# Patient Record
Sex: Female | Born: 1935 | Race: White | Hispanic: No | Smoking: Former smoker
Health system: Southern US, Community
[De-identification: ages and names within clinical notes are randomized; demographics above are authoritative.]

## PROBLEM LIST (undated history)

## (undated) DIAGNOSIS — F32A Depression, unspecified: Secondary | ICD-10-CM

## (undated) DIAGNOSIS — K259 Gastric ulcer, unspecified as acute or chronic, without hemorrhage or perforation: Secondary | ICD-10-CM

## (undated) DIAGNOSIS — H3530 Unspecified macular degeneration: Secondary | ICD-10-CM

## (undated) DIAGNOSIS — K602 Anal fissure, unspecified: Secondary | ICD-10-CM

## (undated) DIAGNOSIS — F419 Anxiety disorder, unspecified: Secondary | ICD-10-CM

## (undated) DIAGNOSIS — F329 Major depressive disorder, single episode, unspecified: Secondary | ICD-10-CM

## (undated) DIAGNOSIS — IMO0001 Reserved for inherently not codable concepts without codable children: Secondary | ICD-10-CM

## (undated) DIAGNOSIS — E785 Hyperlipidemia, unspecified: Secondary | ICD-10-CM

## (undated) DIAGNOSIS — M1990 Unspecified osteoarthritis, unspecified site: Secondary | ICD-10-CM

## (undated) DIAGNOSIS — H548 Legal blindness, as defined in USA: Secondary | ICD-10-CM

## (undated) DIAGNOSIS — E079 Disorder of thyroid, unspecified: Secondary | ICD-10-CM

## (undated) DIAGNOSIS — H9190 Unspecified hearing loss, unspecified ear: Secondary | ICD-10-CM

## (undated) DIAGNOSIS — I10 Essential (primary) hypertension: Secondary | ICD-10-CM

## (undated) DIAGNOSIS — D649 Anemia, unspecified: Secondary | ICD-10-CM

## (undated) DIAGNOSIS — N390 Urinary tract infection, site not specified: Secondary | ICD-10-CM

## (undated) DIAGNOSIS — H409 Unspecified glaucoma: Secondary | ICD-10-CM

## (undated) DIAGNOSIS — Z8711 Personal history of peptic ulcer disease: Secondary | ICD-10-CM

## (undated) HISTORY — DX: Disorder of thyroid, unspecified: E07.9

## (undated) HISTORY — DX: Unspecified glaucoma: H40.9

## (undated) HISTORY — DX: Unspecified hearing loss, unspecified ear: H91.90

## (undated) HISTORY — DX: Anemia, unspecified: D64.9

## (undated) HISTORY — DX: Legal blindness, as defined in USA: H54.8

## (undated) HISTORY — DX: Reserved for inherently not codable concepts without codable children: IMO0001

## (undated) HISTORY — DX: Unspecified osteoarthritis, unspecified site: M19.90

## (undated) HISTORY — PX: FINGER SURGERY: SHX640

## (undated) HISTORY — DX: Gastric ulcer, unspecified as acute or chronic, without hemorrhage or perforation: K25.9

## (undated) HISTORY — DX: Anal fissure, unspecified: K60.2

## (undated) HISTORY — DX: Depression, unspecified: F32.A

## (undated) HISTORY — DX: Anxiety disorder, unspecified: F41.9

## (undated) HISTORY — PX: BACK SURGERY: SHX140

## (undated) HISTORY — DX: Unspecified macular degeneration: H35.30

## (undated) HISTORY — DX: Hyperlipidemia, unspecified: E78.5

## (undated) HISTORY — DX: Urinary tract infection, site not specified: N39.0

## (undated) HISTORY — PX: APPENDECTOMY: SHX54

---
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
## 1999-03-19 ENCOUNTER — Other Ambulatory Visit: Admission: RE | Admit: 1999-03-19 | Discharge: 1999-03-19 | Payer: Self-pay | Admitting: Obstetrics and Gynecology

## 2000-04-24 ENCOUNTER — Other Ambulatory Visit: Admission: RE | Admit: 2000-04-24 | Discharge: 2000-04-24 | Payer: Self-pay | Admitting: Obstetrics and Gynecology

## 2000-05-25 ENCOUNTER — Encounter: Payer: Self-pay | Admitting: Obstetrics and Gynecology

## 2000-05-25 ENCOUNTER — Encounter: Admission: RE | Admit: 2000-05-25 | Discharge: 2000-05-25 | Payer: Self-pay | Admitting: Obstetrics and Gynecology

## 2000-12-27 ENCOUNTER — Emergency Department (HOSPITAL_COMMUNITY): Admission: EM | Admit: 2000-12-27 | Discharge: 2000-12-27 | Payer: Self-pay | Admitting: Internal Medicine

## 2001-06-02 ENCOUNTER — Encounter: Admission: RE | Admit: 2001-06-02 | Discharge: 2001-06-02 | Payer: Self-pay | Admitting: Obstetrics and Gynecology

## 2001-06-02 ENCOUNTER — Encounter: Payer: Self-pay | Admitting: Obstetrics and Gynecology

## 2002-04-03 ENCOUNTER — Emergency Department (HOSPITAL_COMMUNITY): Admission: EM | Admit: 2002-04-03 | Discharge: 2002-04-03 | Payer: Self-pay | Admitting: Emergency Medicine

## 2002-05-18 ENCOUNTER — Encounter: Payer: Self-pay | Admitting: Family Medicine

## 2002-05-18 ENCOUNTER — Encounter: Admission: RE | Admit: 2002-05-18 | Discharge: 2002-05-18 | Payer: Self-pay | Admitting: Family Medicine

## 2003-11-09 ENCOUNTER — Encounter: Admission: RE | Admit: 2003-11-09 | Discharge: 2003-11-09 | Payer: Self-pay | Admitting: Family Medicine

## 2005-01-27 ENCOUNTER — Encounter: Admission: RE | Admit: 2005-01-27 | Discharge: 2005-01-27 | Payer: Self-pay | Admitting: Family Medicine

## 2005-02-18 ENCOUNTER — Other Ambulatory Visit: Admission: RE | Admit: 2005-02-18 | Discharge: 2005-02-18 | Payer: Self-pay | Admitting: Gynecology

## 2005-05-05 ENCOUNTER — Ambulatory Visit: Payer: Self-pay | Admitting: Internal Medicine

## 2006-04-10 ENCOUNTER — Encounter: Admission: RE | Admit: 2006-04-10 | Discharge: 2006-04-10 | Payer: Self-pay | Admitting: Family Medicine

## 2006-09-10 ENCOUNTER — Ambulatory Visit: Payer: Self-pay | Admitting: Internal Medicine

## 2007-06-24 ENCOUNTER — Encounter: Admission: RE | Admit: 2007-06-24 | Discharge: 2007-06-24 | Payer: Self-pay | Admitting: Gynecology

## 2007-06-30 ENCOUNTER — Encounter: Admission: RE | Admit: 2007-06-30 | Discharge: 2007-06-30 | Payer: Self-pay | Admitting: Gynecology

## 2007-09-08 DIAGNOSIS — I10 Essential (primary) hypertension: Secondary | ICD-10-CM | POA: Insufficient documentation

## 2007-09-08 DIAGNOSIS — K279 Peptic ulcer, site unspecified, unspecified as acute or chronic, without hemorrhage or perforation: Secondary | ICD-10-CM | POA: Insufficient documentation

## 2007-09-08 DIAGNOSIS — IMO0002 Reserved for concepts with insufficient information to code with codable children: Secondary | ICD-10-CM

## 2007-09-10 ENCOUNTER — Encounter: Payer: Self-pay | Admitting: Internal Medicine

## 2008-06-23 ENCOUNTER — Encounter: Admission: RE | Admit: 2008-06-23 | Discharge: 2008-06-23 | Payer: Self-pay | Admitting: Family Medicine

## 2009-03-31 ENCOUNTER — Emergency Department (HOSPITAL_COMMUNITY): Admission: EM | Admit: 2009-03-31 | Discharge: 2009-03-31 | Payer: Self-pay | Admitting: Emergency Medicine

## 2009-07-11 ENCOUNTER — Encounter: Admission: RE | Admit: 2009-07-11 | Discharge: 2009-07-11 | Payer: Self-pay | Admitting: Family Medicine

## 2010-08-02 ENCOUNTER — Emergency Department (HOSPITAL_COMMUNITY): Admission: EM | Admit: 2010-08-02 | Discharge: 2010-08-02 | Payer: Self-pay | Admitting: Emergency Medicine

## 2010-09-11 ENCOUNTER — Encounter
Admission: RE | Admit: 2010-09-11 | Discharge: 2010-09-11 | Payer: Self-pay | Source: Home / Self Care | Attending: Family Medicine | Admitting: Family Medicine

## 2010-09-29 ENCOUNTER — Encounter: Payer: Self-pay | Admitting: Gynecology

## 2011-01-24 NOTE — Assessment & Plan Note (Signed)
[facility]                             PULMONARY OFFICE NOTE   NAME:JOHNSONEldred, Lievanos                      MRN:          [id number]  DATE:09/10/2006                            DOB:          16-May-1927    PROBLEM:  Allergic rhinitis.   HISTORY:  One-year followup.  She says she is doing quite well.  Last  weekend she had a viral illness with progressive hoarseness and coughing  with clear sputum, but she is now getting better.  No high fever or  chills.  She asks to keep available standby prescription for chewable  amoxicillin 500 mg t.i.d. x7 days.  We discussed this.  She has not had  cough or urticaria that could be attributed to her Prinivil.   MEDICATIONS:  Limited to lisinopril.  She uses a p.r.n. over-the-counter  antihistamine.   Drug intolerance to PREDNISONE said to make her nervous and NEO-  SYNEPHRINE said to cause tachycardia.   PAST MEDICAL HISTORY:  1. Peptic ulcer disease (Dr. Lina Sar.)  2. Smoked 1 pack per day until she quit in 1983.  3. Left lower lobe 1990.  4. Hypertension, treated with Prinivil.   OBJECTIVE:  Weight 160 pounds.  BP 122/78.  Pulse regular 79.  Room air  saturation 99%.  Nasal airway is clear.  Minimally atrophic with no postnasal drainage  seen.  Conjunctivae are clear.  Voice quality is normal.  LUNGS:  Clear to percussion and auscultation.  HEART:  Sounds are regular without murmur or gallop.   IMPRESSION:  She has had a recent cold, but his getting over it.  Allergic rhinitis has been adequately controlled with over-the-counter  therapies, and this is off season.   PLAN:  1. Chest x-ray because of her history of prolonged smoking, remote.  2. Refillable chewable amoxicillin 500 mg t.i.d. x7 days to use in      case of purulent sputum as discussed.  3. Routine supportive care for her current cold.  4. Schedule return in 12 months, but earlier p.r.n.    Clinton D. Maple Hudson, MD, Tonny Bollman, FACP  Electronically Signed   CDY/MedQ  DD: 09/12/2006  DT: 09/12/2006  Job #: [id number]   cc:   Pam Drown, M.D.

## 2011-04-29 ENCOUNTER — Encounter (INDEPENDENT_AMBULATORY_CARE_PROVIDER_SITE_OTHER): Payer: Self-pay | Admitting: Ophthalmology

## 2011-12-10 ENCOUNTER — Other Ambulatory Visit: Payer: Self-pay | Admitting: Family Medicine

## 2011-12-10 DIAGNOSIS — Z1231 Encounter for screening mammogram for malignant neoplasm of breast: Secondary | ICD-10-CM

## 2011-12-23 ENCOUNTER — Ambulatory Visit
Admission: RE | Admit: 2011-12-23 | Discharge: 2011-12-23 | Disposition: A | Discharge status: Home / Self Care | Payer: Medicare Other | Source: Ambulatory Visit | Attending: Family Medicine | Admitting: Family Medicine

## 2011-12-23 DIAGNOSIS — Z1231 Encounter for screening mammogram for malignant neoplasm of breast: Secondary | ICD-10-CM

## 2012-10-07 ENCOUNTER — Ambulatory Visit (INDEPENDENT_AMBULATORY_CARE_PROVIDER_SITE_OTHER): Payer: Medicare Other | Admitting: Ophthalmology

## 2012-10-18 ENCOUNTER — Ambulatory Visit (INDEPENDENT_AMBULATORY_CARE_PROVIDER_SITE_OTHER): Payer: Medicare Other | Admitting: Ophthalmology

## 2012-10-18 DIAGNOSIS — H3530 Unspecified macular degeneration: Secondary | ICD-10-CM

## 2012-10-18 DIAGNOSIS — H43819 Vitreous degeneration, unspecified eye: Secondary | ICD-10-CM

## 2012-10-18 DIAGNOSIS — I10 Essential (primary) hypertension: Secondary | ICD-10-CM

## 2012-10-18 DIAGNOSIS — H35039 Hypertensive retinopathy, unspecified eye: Secondary | ICD-10-CM

## 2013-02-21 ENCOUNTER — Encounter (HOSPITAL_COMMUNITY): Payer: Self-pay | Admitting: Emergency Medicine

## 2013-02-21 ENCOUNTER — Emergency Department (HOSPITAL_COMMUNITY)
Admission: EM | Admit: 2013-02-21 | Discharge: 2013-02-21 | Disposition: A | Discharge status: Home / Self Care | Payer: Medicare Other | Attending: Emergency Medicine | Admitting: Emergency Medicine

## 2013-02-21 ENCOUNTER — Emergency Department (HOSPITAL_COMMUNITY): Payer: Medicare Other

## 2013-02-21 DIAGNOSIS — Z79899 Other long term (current) drug therapy: Secondary | ICD-10-CM | POA: Insufficient documentation

## 2013-02-21 DIAGNOSIS — Z8711 Personal history of peptic ulcer disease: Secondary | ICD-10-CM | POA: Insufficient documentation

## 2013-02-21 DIAGNOSIS — R002 Palpitations: Secondary | ICD-10-CM

## 2013-02-21 LAB — CBC
HCT: 40.1 % (ref 36.0–46.0)
Hemoglobin: 13.5 g/dL (ref 12.0–15.0)
MCH: 28.6 pg (ref 26.0–34.0)
MCHC: 33.7 g/dL (ref 30.0–36.0)
MCV: 85 fL (ref 78.0–100.0)
Platelets: 333 10*3/uL (ref 150–400)
RDW: 12.9 % (ref 11.5–15.5)
WBC: 6.9 10*3/uL (ref 4.0–10.5)

## 2013-02-21 LAB — POCT I-STAT TROPONIN I: Troponin i, poc: 0.01 ng/mL (ref 0.00–0.08)

## 2013-02-21 LAB — COMPREHENSIVE METABOLIC PANEL
AST: 17 U/L (ref 0–37)
Calcium: 9.8 mg/dL (ref 8.4–10.5)
GFR calc non Af Amer: 77 mL/min — ABNORMAL LOW (ref >90)
Total Protein: 7.8 g/dL (ref 6.0–8.3)

## 2013-02-21 NOTE — ED Provider Notes (Signed)
History   CSN: [id number] Arrival date & time 02/21/13  4098 First MD Initiated Contact with Patient 02/21/13 [phone]     Chief Complaint  Patient presents with  . Irregular Heart Beat   HPI Patient presents to the emergency room with complaints of palpitations that started about 3 days ago. Patient denies any issues with dizziness, nausea vomiting or chest pain. Palpitations are sporadic she does not feel that her heart is going particularly fast. Right now she feels fine and does not have any complaints. The patient was concerned though because approximately 18 years ago she had a similar episode. She was evaluated by cardiology a couple of times. Eventually the patient ended up becoming anemic and he determined that she had a bleeding gastric ulcer. She required blood transfusions at that time. Patient denies any abdominal pain. She has not noticed any blood in her stool but her vision is poor.  History reviewed. No pertinent past medical history.  History reviewed. No pertinent past surgical history.  No family history on file.  History  Substance Use Topics  . Smoking status: Not on file  . Smokeless tobacco: Not on file  . Alcohol Use: Not on file    OB History   Grav Para Term Preterm Abortions TAB SAB Ect Mult Living                  Review of Systems  All other systems reviewed and are negative.    Allergies  Prednisone  Home Medications   Current Outpatient Rx  Name  Route  Sig  Dispense  Refill  . beta carotene w/minerals (OCUVITE) tablet   Oral   Take 1-2 tablets by mouth daily.         Marland Kitchen latanoprost (XALATAN) 0.005 % ophthalmic solution   Both Eyes   Place 1 drop into both eyes every morning.         Marland Kitchen lisinopril-hydrochlorothiazide (PRINZIDE,ZESTORETIC) 20-12.5 MG per tablet   Oral   Take 1 tablet by mouth daily.         . sodium chloride (OCEAN) 0.65 % nasal spray   Nasal   Place 1 spray into the nose daily as needed for congestion.            BP 127/64  Pulse 66  Temp(Src) 97.4 F (36.3 C) (Oral)  Resp 18  Ht 5' 4.5" (1.638 m)  Wt 145 lb (65.772 kg)  BMI 24.51 kg/m2  SpO2 98%  Physical Exam  Nursing note and vitals reviewed. Constitutional: She appears well-developed and well-nourished. No distress.  HENT:  Head: Normocephalic and atraumatic.  Right Ear: External ear normal.  Left Ear: External ear normal.  Eyes: Conjunctivae are normal. Right eye exhibits no discharge. Left eye exhibits no discharge. No scleral icterus.  Neck: Neck supple. No tracheal deviation present.  Cardiovascular: Normal rate, regular rhythm and intact distal pulses.   Pulmonary/Chest: Effort normal and breath sounds normal. No stridor. No respiratory distress. She has no wheezes. She has no rales.  Abdominal: Soft. Bowel sounds are normal. She exhibits no distension. There is no tenderness. There is no rebound and no guarding.  Musculoskeletal: She exhibits no edema and no tenderness.  Neurological: She is alert. She has normal strength. No sensory deficit. Cranial nerve deficit:  no gross defecits noted. She exhibits normal muscle tone. She displays no seizure activity. Coordination normal.  Skin: Skin is warm and dry. No rash noted.  Psychiatric: She has a normal mood and  affect.    ED Course  Procedures (including critical care time)  Rate: 71  Rhythm: normal sinus rhythm  QRS Axis: normal  Intervals: normal  ST/T Wave abnormalities: normal  Conduction Disutrbances:none  Narrative Interpretation: nl except poor r wave progression anterior leads  Old EKG Reviewed: none available  Labs Reviewed  COMPREHENSIVE METABOLIC PANEL - Abnormal; Notable for the following:    Glucose, Bld 101 (*)    GFR calc non Af Amer 77 (*)    GFR calc Af Amer 89 (*)    All other components within normal limits  CBC  POCT I-STAT TROPONIN I   Dg Chest 2 View  02/21/2013   *RADIOLOGY REPORT*  Clinical Data: Shortness of breath with cough for 3 days.  Tachycardia.  History of hypertension.  CHEST - 2 VIEW  Comparison: 03/31/2009.  Findings: The heart size and mediastinal contours are stable.  The lungs are clear.  There is no pleural effusion or pneumothorax. The osseous structures appear unchanged.  Telemetry leads overlie the chest.  IMPRESSION: Stable chest.  No acute cardiopulmonary process.   Original Report Authenticated By: Carey Bullocks, M.D.     1. Palpitations       MDM  Patient's evaluation was reassuring in emergency department. She has no cardiac dysrhythmia noted on her EKG. Patient is not anemic.  Her vital signs have remained stable.  Monitor did indicate that she was having occasional PVCs although I did not observe any loss of the bedside. This possible this could be the cause of her symptoms I recommend that she follow up with her primary Dr. Consider Holter monitoring testing.        Celene Kras, MD 02/21/13 [phone]

## 2013-02-21 NOTE — ED Notes (Signed)
Pt c/o heart palpitations that started 3 days ago. Pt states that this is a new onset, not associated with any dizziness, nausea/vomiting, chest pain. NAD at this time.

## 2013-04-13 ENCOUNTER — Other Ambulatory Visit: Payer: Self-pay

## 2013-05-10 ENCOUNTER — Other Ambulatory Visit: Payer: Self-pay

## 2013-05-10 DIAGNOSIS — Z1231 Encounter for screening mammogram for malignant neoplasm of breast: Secondary | ICD-10-CM

## 2013-06-01 ENCOUNTER — Ambulatory Visit: Payer: Medicare Other

## 2013-06-07 ENCOUNTER — Ambulatory Visit
Admission: RE | Admit: 2013-06-07 | Discharge: 2013-06-07 | Disposition: A | Discharge status: Home / Self Care | Payer: Medicare Other | Source: Ambulatory Visit

## 2013-06-07 DIAGNOSIS — Z1231 Encounter for screening mammogram for malignant neoplasm of breast: Secondary | ICD-10-CM

## 2013-06-30 ENCOUNTER — Telehealth: Payer: Self-pay | Admitting: Pulmonary Disease

## 2013-06-30 ENCOUNTER — Emergency Department (HOSPITAL_COMMUNITY)
Admission: EM | Admit: 2013-06-30 | Discharge: 2013-06-30 | Disposition: A | Discharge status: Home / Self Care | Payer: Medicare Other | Attending: Emergency Medicine | Admitting: Emergency Medicine

## 2013-06-30 ENCOUNTER — Emergency Department (HOSPITAL_COMMUNITY): Payer: Medicare Other

## 2013-06-30 ENCOUNTER — Encounter (HOSPITAL_COMMUNITY): Payer: Self-pay | Admitting: Emergency Medicine

## 2013-06-30 DIAGNOSIS — R059 Cough, unspecified: Secondary | ICD-10-CM | POA: Insufficient documentation

## 2013-06-30 DIAGNOSIS — J984 Other disorders of lung: Secondary | ICD-10-CM | POA: Insufficient documentation

## 2013-06-30 DIAGNOSIS — R05 Cough: Secondary | ICD-10-CM | POA: Insufficient documentation

## 2013-06-30 DIAGNOSIS — I10 Essential (primary) hypertension: Secondary | ICD-10-CM | POA: Insufficient documentation

## 2013-06-30 DIAGNOSIS — Z79899 Other long term (current) drug therapy: Secondary | ICD-10-CM | POA: Insufficient documentation

## 2013-06-30 DIAGNOSIS — R911 Solitary pulmonary nodule: Secondary | ICD-10-CM

## 2013-06-30 DIAGNOSIS — J189 Pneumonia, unspecified organism: Secondary | ICD-10-CM

## 2013-06-30 HISTORY — DX: Essential (primary) hypertension: I10

## 2013-06-30 LAB — BASIC METABOLIC PANEL
BUN: 9 mg/dL (ref 6–23)
CO2: 26 mEq/L (ref 19–32)
Calcium: 9.6 mg/dL (ref 8.4–10.5)
Chloride: 98 mEq/L (ref 96–112)
GFR calc non Af Amer: 79 mL/min — ABNORMAL LOW (ref >90)
Glucose, Bld: 93 mg/dL (ref 70–99)
Potassium: 3.6 mEq/L (ref 3.5–5.1)
Sodium: 133 mEq/L — ABNORMAL LOW (ref 135–145)

## 2013-06-30 LAB — CBC WITH DIFFERENTIAL/PLATELET
Basophils Relative: 0 % (ref 0–1)
Eosinophils Absolute: 0.1 10*3/uL (ref 0.0–0.7)
HCT: 39.4 % (ref 36.0–46.0)
Hemoglobin: 12.9 g/dL (ref 12.0–15.0)
Lymphs Abs: 1.5 10*3/uL (ref 0.7–4.0)
MCH: 28 pg (ref 26.0–34.0)
MCHC: 32.7 g/dL (ref 30.0–36.0)
Monocytes Absolute: 1.1 10*3/uL — ABNORMAL HIGH (ref 0.1–1.0)
Monocytes Relative: 10 % (ref 3–12)
Neutro Abs: 8.2 10*3/uL — ABNORMAL HIGH (ref 1.7–7.7)
RBC: 4.6 MIL/uL (ref 3.87–5.11)
RDW: 13.3 % (ref 11.5–15.5)

## 2013-06-30 MED ORDER — IOHEXOL 300 MG/ML  SOLN
80.0000 mL | Freq: Once | INTRAMUSCULAR | Status: AC | PRN
  Administered 2013-06-30: 80 mL via INTRAVENOUS

## 2013-06-30 MED ORDER — ALBUTEROL SULFATE (5 MG/ML) 0.5% IN NEBU
5.0000 mg | INHALATION_SOLUTION | Freq: Once | RESPIRATORY_TRACT | Status: AC
Start: 2013-06-30 — End: 2013-06-30
  Administered 2013-06-30: 5 mg via RESPIRATORY_TRACT
  Filled 2013-06-30: qty 1

## 2013-06-30 MED ORDER — DOXYCYCLINE HYCLATE 100 MG PO CAPS: 100.0000 mg | ORAL_CAPSULE | Freq: Two times a day (BID) | ORAL | Status: DC

## 2013-06-30 MED ORDER — SODIUM CHLORIDE 0.9 % IN NEBU
INHALATION_SOLUTION | RESPIRATORY_TRACT | Status: AC
  Filled 2013-06-30: qty 3

## 2013-06-30 MED ORDER — ALBUTEROL SULFATE HFA 108 (90 BASE) MCG/ACT IN AERS: 1.0000 | INHALATION_SPRAY | Freq: Four times a day (QID) | RESPIRATORY_TRACT | Status: DC | PRN

## 2013-06-30 NOTE — ED Notes (Signed)
Pt reports that she has been ShOB since Saturday and was hoarse with a cough on Sunday, pt was seen at [facility] on Sunday and was told to drink fluids and take a decongestant. Pt reports she feels like her congestion is not improving.

## 2013-06-30 NOTE — ED Provider Notes (Signed)
CSN: [id number]     Arrival date & time 06/30/13  6578 History   First MD Initiated Contact with Patient 06/30/13 [phone]     Chief Complaint  Patient presents with  . Shortness of Breath  . Cough   (Consider location/radiation/quality/duration/timing/severity/associated sxs/prior Treatment) HPI Comments: Patient is an 79 year old female with a past medical history of hypertension who presents with a 5 day history of SOB with associated cough. Symptoms started gradually and progressively worsened since the onset. Patient saw a doctor at [facility] a few days ago and was instructed to take decongestants and drink plenty of fluids. Patient abided by these instructions and did not feel any improvement. Patient reports associated chest congestion. No aggravating/alleviating factors. No other associated symptoms.   Patient is a 79 y.o. female presenting with shortness of breath and cough.  Shortness of Breath Associated symptoms: cough   Cough Associated symptoms: shortness of breath     Past Medical History  Diagnosis Date  . Hypertension    History reviewed. No pertinent past surgical history. History reviewed. No pertinent family history. History  Substance Use Topics  . Smoking status: Unknown If Ever Smoked  . Smokeless tobacco: Never Used  . Alcohol Use: No   OB History   Grav Para Term Preterm Abortions TAB SAB Ect Mult Living                 Review of Systems  Respiratory: Positive for cough and shortness of breath.   All other systems reviewed and are negative.    Allergies  Prednisone  Home Medications   Current Outpatient Rx  Name  Route  Sig  Dispense  Refill  . beta carotene w/minerals (OCUVITE) tablet   Oral   Take 1-2 tablets by mouth daily.         Marland Kitchen latanoprost (XALATAN) 0.005 % ophthalmic solution   Both Eyes   Place 1 drop into both eyes every morning.         Marland Kitchen lisinopril-hydrochlorothiazide (PRINZIDE,ZESTORETIC) 20-12.5 MG per tablet    Oral   Take 1 tablet by mouth daily.         . sodium chloride (OCEAN) 0.65 % nasal spray   Nasal   Place 1 spray into the nose daily as needed for congestion.          BP 173/92  Pulse 80  Temp(Src) 97.8 F (36.6 C) (Oral)  Resp 20  SpO2 100% Physical Exam  Nursing note and vitals reviewed. Constitutional: She is oriented to person, place, and time. She appears well-developed and well-nourished. No distress.  HENT:  Head: Normocephalic and atraumatic.  Eyes: Conjunctivae and EOM are normal.  Neck: Normal range of motion.  Cardiovascular: Normal rate and regular rhythm.  Exam reveals no gallop and no friction rub.   No murmur heard. Pulmonary/Chest: Effort normal and breath sounds normal. She has no wheezes. She has no rales. She exhibits no tenderness.  Abdominal: Soft. She exhibits no distension. There is no tenderness. There is no rebound and no guarding.  Musculoskeletal: Normal range of motion.  Neurological: She is alert and oriented to person, place, and time. Coordination normal.  Speech is goal-oriented. Moves limbs without ataxia.   Skin: Skin is warm and dry.  Psychiatric: She has a normal mood and affect. Her behavior is normal.    ED Course  Procedures (including critical care time) Labs Review Labs Reviewed  CBC WITH DIFFERENTIAL - Abnormal; Notable for the following:  WBC 11.0 (*)    Neutro Abs 8.2 (*)    Monocytes Absolute 1.1 (*)    All other components within normal limits  BASIC METABOLIC PANEL - Abnormal; Notable for the following:    Sodium 133 (*)    GFR calc non Af Amer 79 (*)    All other components within normal limits   Imaging Review Dg Chest 2 View  06/30/2013   CLINICAL DATA:  Cough, shortness of breath, congestion, chest tightness. Previous smoker.  EXAM: CHEST  2 VIEW  COMPARISON:  02/21/2013.  FINDINGS: Emphysematous changes in the lungs. There are 2 nodular opacities demonstrated, an 8 mm opacity in the right mid lung and a 7 mm  opacity in the left mid lung. These are new since the previous study. CT is recommended for further evaluation. Given the short interval appearance of these nodules, inflammatory process may be present. However, developing neoplastic nodules are not excluded. No airspace disease in the lungs. No blunting of costophrenic angles. Normal heart size and pulmonary vascularity. Calcified and tortuous aorta. Degenerative changes in the spine.  IMPRESSION: New appearance of indeterminate nodules in both lungs. CT recommended for further evaluation. Diffuse emphysematous changes. No focal consolidation.   Electronically Signed   By: Burman Nieves M.D.   On: 06/30/2013 06:39   Ct Chest W Contrast  06/30/2013   CLINICAL DATA:  Evaluate lung nodules  EXAM: CT CHEST WITH CONTRAST  TECHNIQUE: Multidetector CT imaging of the chest was performed during intravenous contrast administration.  CONTRAST:  80mL OMNIPAQUE IOHEXOL 300 MG/ML  SOLN  COMPARISON:  06/30/2013  FINDINGS: There is no pleural effusion identified. No significant airspace consolidation identified.  There is mild bronchiectasis within the right middle lobe and right upper lobe. Peripheral tree-in-bud nodules are identified involving the upper lobes and right middle lobe. Rounded nodule in the right upper lobe does not have the typical tree-in-bud configuration measuring 7 mm.  The trachea appears patent and is midline. The heart size appears normal. No pericardial effusion. No mediastinal or hilar adenopathy identified. There is no enlarged axillary or supraclavicular lymph nodes.  Incidental imaging through the upper abdomen shows a low attenuation structure within the posterior right hepatic lobe measuring 7 mm, image 52/ series 2. More centrally located is a 5 mm hypodensity. No acute findings identified within the upper abdomen.  Review of the visualized bony structures is significant for mild thoracic spondylosis. No aggressive lytic or sclerotic bone  abnormalities identified.  IMPRESSION: 1. The spectrum of findings within the lungs are consistent with a chronic atypical indolent infection. 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 Rounded nodule in the right upper lobe does not have the typical tree in bud configuration of bronchiolitis. If the patient is at high risk for bronchogenic carcinoma, follow-up chest CT at 3-59months is recommended. If the patient is at low risk for bronchogenic carcinoma, follow-up chest CT at 6-12 months is recommended. This recommendation follows the consensus statement: Guidelines for Management of Small Pulmonary Nodules Detected on CT Scans: A Statement from the Fleischner Society as published in Radiology 2005; 237:395-400.   Electronically Signed   By: Signa Kell M.D.   On: 06/30/2013 08:33    EKG Interpretation   None       MDM   1. Lung nodule   2. Lung infection     6:34 AM Chest xray pending. Vitals stable and patient afebrile.   9:33 AM Chest xray recommended follow up CT,  which was ordered here. CT shows chronic atypical infection and also a 7mm nodule that should be monitored. I spoke with Dr. Craige Cotta who recommend Doxycycline for now and follow up with Pulmonologist in office. Patient will have albuterol inhaler and be discharged home.    Emilia Beck, [place] 06/30/13 [phone]

## 2013-06-30 NOTE — Telephone Encounter (Signed)
Ct Chest W Contrast 06/30/2013   CLINICAL DATA:  Evaluate lung nodules  EXAM: CT CHEST WITH CONTRAST  TECHNIQUE: Multidetector CT imaging of the chest was performed during intravenous contrast administration.  CONTRAST:  80mL OMNIPAQUE IOHEXOL 300 MG/ML  SOLN  COMPARISON:  06/30/2013   FINDINGS:  There is no pleural effusion identified. No significant airspace consolidation identified.  There is mild bronchiectasis within the right middle lobe and right upper lobe. Peripheral tree-in-bud nodules are identified involving the upper lobes and right middle lobe. Rounded nodule in the right upper lobe does not have the typical tree-in-bud configuration measuring 7 mm.  The trachea appears patent and is midline. The heart size appears normal. No pericardial effusion. No mediastinal or hilar adenopathy identified. There is no enlarged axillary or supraclavicular lymph nodes.  Incidental imaging through the upper abdomen shows a low attenuation structure within the posterior right hepatic lobe measuring 7 mm, image 52/ series 2. More centrally located is a 5 mm hypodensity. No acute findings identified within the upper abdomen.  Review of the visualized bony structures is significant for mild thoracic spondylosis. No aggressive lytic or sclerotic bone abnormalities identified.  IMPRESSION:  1. The spectrum of findings within the lungs are consistent with a chronic atypical indolent infection. 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 Rounded nodule in the right upper lobe does not have the typical tree in bud configuration of bronchiolitis. If the patient is at high risk for bronchogenic carcinoma, follow-up chest CT at 3-71months is recommended. If the patient is at low risk for bronchogenic carcinoma, follow-up chest CT at 6-12 months is recommended.  This recommendation follows the consensus statement: Guidelines for Management of Small Pulmonary Nodules Detected on CT Scans: A Statement  from the Fleischner Society as published in Radiology 2005; 237:395-400.    Electronically Signed   By: Signa Kell M.D.   On: 06/30/2013 08:33    Received call from ER doctor at [facility].  Pt presented with several days of cough and sputum.  Had been on amoxicillin.  Was found to have abnormal chest xray prompting need for CT chest.  EDP is planning on discharging pt with additional antibiotics, and has requested that pt have outpt pulmonary consultation to further assess CT chest findings.   Will route to Triage to call pt and arrange for next available pulmonary consult visit >> can be with any provider, and does not need to be with me if I do not have appointment available soon.

## 2013-06-30 NOTE — Telephone Encounter (Signed)
I spoke with pt son. Appt scheduled with MW 07/01/13 at 10 AM. Aware to arrive 15 min early. He also was giving our address. Nothing further needed

## 2013-06-30 NOTE — Telephone Encounter (Signed)
Pt's son Noni Saupe) returned call and can be reached @ [phone]. Dana Blake

## 2013-06-30 NOTE — Telephone Encounter (Signed)
Called home # [id number] x1  Called mobile # and it states NA at this time [facility]

## 2013-06-30 NOTE — ED Provider Notes (Signed)
Medical screening examination/treatment/procedure(s) were conducted as a shared visit with non-physician practitioner(s) and myself.  I personally evaluated the patient during the encounter.  EKG Interpretation   None      Pt with one week of cough, congestion.  No fever.  No travel to endemic fungal areas ([place], [place]).  No b type symptoms, night sweats.  CXR with nodules, plan for CT chest for further evaluation.  Olivia Mackie, MD 06/30/13 [phone]

## 2013-07-01 ENCOUNTER — Encounter: Payer: Self-pay | Admitting: Internal Medicine

## 2013-07-01 ENCOUNTER — Ambulatory Visit (INDEPENDENT_AMBULATORY_CARE_PROVIDER_SITE_OTHER): Payer: Medicare Other | Admitting: Internal Medicine

## 2013-07-01 VITALS — BP 122/64 | HR 76 | Temp 98.0°F | Ht 63.5 in | Wt 146.0 lb

## 2013-07-01 DIAGNOSIS — IMO0002 Reserved for concepts with insufficient information to code with codable children: Secondary | ICD-10-CM

## 2013-07-01 DIAGNOSIS — R05 Cough: Secondary | ICD-10-CM

## 2013-07-01 MED ORDER — OLMESARTAN MEDOXOMIL-HCTZ 20-12.5 MG PO TABS: 1.0000 | ORAL_TABLET | Freq: Every day | ORAL | Status: DC

## 2013-07-01 MED ORDER — TRAMADOL HCL 50 MG PO TABS: ORAL_TABLET | ORAL | Status: DC

## 2013-07-01 NOTE — Progress Notes (Signed)
  Subjective:    Patient ID: Dana Blake, female    DOB: 01-May-1927   MRN: [id number]  HPI  49 yowf quit smoking around 1986 with resolution of cough and able to yard work abruptly ill 06/25/13 of hoarsness and cough and chest tight then saw UC 06/26/13 and got worse with rx as bronchitis so referred by ED to pulmonary clinic on 07/01/13    07/01/2013 [facility]st [facility] office visit/ Dana Blake cc abuptly ill p worked in yard 06/25/13 with persistent hoarseness, severe dry cough, no fever,  No sob unless coughing rx with doxy.   Has saba but not using but after neb slt yellow mucus.  No obvious day to day or daytime variabilty or assoc  cp or chest tightness, subjective wheeze overt   hb symptoms. No unusual exp hx or h/o childhood pna/ asthma or knowledge of premature birth.  Sleeping ok last night without nocturnal  or early am exacerbation  of respiratory  c/o's or need for noct saba. Also denies any obvious fluctuation of symptoms with weather or environmental changes or other aggravating or alleviating factors except as outlined above   Current Medications, Allergies, Complete Past Medical History, Past Surgical History, Family History, and Social History were reviewed in [employer] record.          Review of Systems  Constitutional: Negative for fever, chills and unexpected weight change.  HENT: Positive for congestion, sneezing and sore throat. Negative for dental problem, ear pain, nosebleeds, postnasal drip, rhinorrhea, sinus pressure, trouble swallowing and voice change.   Eyes: Negative for visual disturbance.  Respiratory: Positive for cough and shortness of breath. Negative for choking.   Cardiovascular: Negative for chest pain and leg swelling.  Gastrointestinal: Negative for vomiting, abdominal pain and diarrhea.  Genitourinary: Negative for difficulty urinating.  Musculoskeletal: Positive for arthralgias.  Skin: Negative for rash.  Neurological:  Negative for tremors, syncope and headaches.  Hematological: Does not bruise/bleed easily.       Objective:   Physical Exam   amb talkative slt hoarse wf nad  Wt Readings from Last 3 Encounters:  07/01/13 146 lb (66.225 kg)  02/21/13 145 lb (65.772 kg)    HEENT: nl dentition, turbinates, and orophanx. Nl external ear canals without cough reflex   NECK :  without JVD/Nodes/TM/ nl carotid upstrokes bilaterally   LUNGS: no acc muscle use, clear to A and P bilaterally without cough on insp or exp maneuvers   CV:  RRR  no s3 or murmur or increase in P2, no edema   ABD:  soft and nontender with nl excursion in the supine position. No bruits or organomegaly, bowel sounds nl  MS:  warm without deformities, calf tenderness, cyanosis or clubbing  SKIN: warm and dry without lesions    NEURO:  alert, approp, no deficits      CT chest  06/30/13 1. The spectrum of findings within the lungs are consistent with a  chronic atypical indolent infection. 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 Rounded nodule in the right upper lobe does not have the typical  tree in bud configuration of bronchiolitis.      Assessment & Plan:

## 2013-07-01 NOTE — Patient Instructions (Addendum)
Take delsym two tsp every 12 hours and supplement if needed with  tramadol 50 mg up to 2 every 4 hours to suppress the urge to cough. Swallowing water or using ice chips/non mint and menthol containing candies (such as lifesavers or sugarless jolly ranchers) are also effective.  You should rest your voice and avoid activities that you know make you cough.  Once you have eliminated the cough for 3 straight days try reducing the tramadol first,  then the delsym as tolerated.    Try prilosec 20mg   Take 30-60 min before first meal of the day and Pepcid 20 mg one bedtime until cough is completely gone for at least a week without the need for cough suppression  Stop lisinopril  Start benicar 20-12.5 one daily break in half if too strong  Please schedule a follow up office visit in 4 weeks, sooner if needed with cxr on return

## 2013-07-03 DIAGNOSIS — R05 Cough: Secondary | ICD-10-CM | POA: Insufficient documentation

## 2013-07-03 NOTE — Assessment & Plan Note (Addendum)
ACE inhibitors are problematic in  pts with airway complaints because  even experienced pulmonologists can't always distinguish ace effects from copd/asthma.  By themselves they don't actually cause a problem, much like oxygen can't by itself start a fire, but they certainly serve as a powerful catalyst or enhancer for any "fire"  or inflammatory process in the upper airway, be it caused by an ET  tube or more commonly reflux (especially in the obese or pts with known GERD or who are on biphoshonates).    In the era of ARB near equivalency until we have a better handle on the reversibility of the airway problem, it just makes sense to avoid ACEI  entirely in the short run and then decide later, having established a level of airway control using a reasonable limited regimen, whether to add back ace but even then being very careful to observe the pt for worsening airway control and number of meds used/ needed to control symptoms.    Start benicar 20-12.5 one daily break in half if too strong

## 2013-07-03 NOTE — Assessment & Plan Note (Signed)
Abrupt refractory coughing is most c/w  Classic Upper airway cough syndrome, so named because it's frequently impossible to sort out how much is  CR/sinusitis with freq throat clearing (which can be related to primary GERD)   vs  causing  secondary (" extra esophageal")  GERD from wide swings in gastric pressure that occur with throat clearing, often  promoting self use of mint and menthol lozenges that reduce the lower esophageal sphincter tone and exacerbate the problem further in a cyclical fashion.   These are the same pts (now being labeled as having "irritable larynx syndrome" by some cough centers) who not infrequently have a history of having failed to tolerate ace inhibitors,  dry powder inhalers or biphosphonates or report having atypical reflux symptoms that don't respond to standard doses of PPI , and are easily confused as having aecopd or asthma flares by even experienced allergists/ pulmonologists.  For now stop the acei and rx GERD empirically only in the short term (while coughing ) then return to regroup in 4 weeks, sooner if needed

## 2013-07-12 ENCOUNTER — Telehealth: Payer: Self-pay | Admitting: Internal Medicine

## 2013-07-12 NOTE — Telephone Encounter (Signed)
I spoke with pt. She is aware. Nothing further needed 

## 2013-07-12 NOTE — Telephone Encounter (Signed)
I spoke with pt. She was giving samples of benicar 20-12.5 mg QD. She reports couple days after taking this medication her BP was 182/77 (at the pharmacy). Pt then checker her BP at home every other day. It's fluctuating from 152/70-130/70. She wants to know if this is normal with the benicar? Please advise Dr. Sherene Sires thanks

## 2013-07-12 NOTE — Telephone Encounter (Signed)
Yes that's always a concern when changing meds around but it should level out where she wants it or thw benicar will need to be taken twice daily and the strength adjusted next ov

## 2013-07-29 ENCOUNTER — Ambulatory Visit (INDEPENDENT_AMBULATORY_CARE_PROVIDER_SITE_OTHER)
Admission: RE | Admit: 2013-07-29 | Discharge: 2013-07-29 | Disposition: A | Discharge status: Home / Self Care | Payer: Medicare Other | Source: Ambulatory Visit | Attending: Internal Medicine | Admitting: Internal Medicine

## 2013-07-29 ENCOUNTER — Ambulatory Visit (INDEPENDENT_AMBULATORY_CARE_PROVIDER_SITE_OTHER): Payer: Medicare Other | Admitting: Internal Medicine

## 2013-07-29 ENCOUNTER — Encounter: Payer: Self-pay | Admitting: Internal Medicine

## 2013-07-29 VITALS — BP 120/60 | HR 68 | Temp 97.5°F | Ht 63.5 in | Wt 146.0 lb

## 2013-07-29 DIAGNOSIS — R918 Other nonspecific abnormal finding of lung field: Secondary | ICD-10-CM | POA: Insufficient documentation

## 2013-07-29 DIAGNOSIS — IMO0002 Reserved for concepts with insufficient information to code with codable children: Secondary | ICD-10-CM

## 2013-07-29 DIAGNOSIS — R05 Cough: Secondary | ICD-10-CM

## 2013-07-29 DIAGNOSIS — R059 Cough, unspecified: Secondary | ICD-10-CM

## 2013-07-29 MED ORDER — LOSARTAN POTASSIUM-HCTZ 50-12.5 MG PO TABS: 1.0000 | ORAL_TABLET | Freq: Every day | ORAL | Status: DC

## 2013-07-29 NOTE — Assessment & Plan Note (Signed)
CT chest 06/30/13 The spectrum of findings within the lungs are consistent with a  chronic atypical indolent infection. 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 Rounded nodule in the right upper lobe does not have the typical  tree in bud configuration of bronchiolitis.  Discussed in detail all the  indications, usual  risks and alternatives  relative to the benefits with patient who agrees to proceed with conservative f/u as Plain cxr is normal and no longer having any symptoms though there is still likely "microscopic" change on CT chest .  At age 79 she is reluctant to pursue these changes and opted for f/u cxr in 6 months which is fine with me (here or at Dr Zachery Dauer' office )  Pulmonary f/u prn

## 2013-07-29 NOTE — Assessment & Plan Note (Addendum)
ACEi cough resolved, benicar too strong so rec losartan 50-12.5 one daily   Further titration per Dr Zachery Dauer

## 2013-07-29 NOTE — Progress Notes (Signed)
Subjective:    Patient ID: Dana Blake, female    DOB: May 16, 1927   MRN: [id number]    Brief patient profile:  86 yowf quit smoking around 1986 with resolution of cough and able to yard work abruptly ill 06/25/13 of hoarsness and cough and chest tight then saw UC 06/26/13 and got worse with rx as bronchitis so referred by ED to pulmonary clinic on 07/01/13   History of Present Illness  07/01/2013 1st [facility] office visit/ Wert cc abuptly ill p worked in yard 06/25/13 with persistent hoarseness, severe dry cough, no fever, no sob unless coughing rx with doxy.   Has saba but not using but after neb slt yellow mucus. rec Take delsym two tsp every 12 hours and supplement if needed with  tramadol 50 mg up to 2 every 4 hours to suppress the urge to cough.   Try prilosec 20mg   Take 30-60 min before first meal of the day and Pepcid 20 mg one bedtime until cough is completely gone for at least a week without the need for cough suppression Stop lisinopril Start benicar 20-12.5 one daily break in half if too strong   07/29/2013 f/u ov/Wert re:  MPN's/ cough  Chief Complaint  Patient presents with  . Follow-up    Pt states that her cough has resolved. No new co's today.      Not limited by sob. Some light headedness even on one half benicar  No obvious day to day or daytime variabilty or assoc chronic cough or cp or chest tightness, subjective wheeze overt sinus or hb symptoms. No unusual exp hx or h/o childhood pna/ asthma or knowledge of premature birth.  Sleeping ok without nocturnal  or early am exacerbation  of respiratory  c/o's or need for noct saba. Also denies any obvious fluctuation of symptoms with weather or environmental changes or other aggravating or alleviating factors except as outlined above   Current Medications, Allergies, Complete Past Medical History, Past Surgical History, Family History, and Social History were reviewed in [employer]  record.  ROS  The following are not active complaints unless bolded sore throat, dysphagia, dental problems, itching, sneezing,  nasal congestion or excess/ purulent secretions, ear ache,   fever, chills, sweats, unintended wt loss, pleuritic or exertional cp, hemoptysis,  orthopnea pnd or leg swelling, presyncope, palpitations, heartburn, abdominal pain, anorexia, nausea, vomiting, diarrhea  or change in bowel or urinary habits, change in stools or urine, dysuria,hematuria,  rash, arthralgias, visual complaints, headache, numbness weakness or ataxia or problems with walking or coordination,  change in mood/affect or memory.                      Objective:   Physical Exam   amb talkative   wf nad no longer hoarse  Wt Readings from Last 3 Encounters:  07/29/13 146 lb (66.225 kg)  07/01/13 146 lb (66.225 kg)  02/21/13 145 lb (65.772 kg)       HEENT: nl dentition, turbinates, and orophanx. Nl external ear canals without cough reflex   NECK :  without JVD/Nodes/TM/ nl carotid upstrokes bilaterally   LUNGS: no acc muscle use, clear to A and P bilaterally without cough on insp or exp maneuvers   CV:  RRR  no s3 or murmur or increase in P2, no edema   ABD:  soft and nontender with nl excursion in the supine position. No bruits or organomegaly, bowel sounds nl  MS:  warm without deformities, calf tenderness, cyanosis or clubbing  SKIN: warm and dry without lesions           CT chest  06/30/13 1. The spectrum of findings within the lungs are consistent with a  chronic atypical indolent infection. 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 Rounded nodule in the right upper lobe does not have the typical  tree in bud configuration of bronchiolitis.  CXR  07/29/2013 :  Mild bibasilar opacities, favored to reflect atelectasis.       Assessment & Plan:

## 2013-07-29 NOTE — Assessment & Plan Note (Signed)
-   trial off acei 07/03/2013 > resolved   Convincing improvement off acei so I would not rechallenge

## 2013-07-29 NOTE — Patient Instructions (Addendum)
Stop benicar Start losartan-hct 50/12.5 daily in place of benicar   Follow up with Dr Zachery Dauer after Christmas for your blood pressure and I recommend a follow up cxr in 6 months which you can do there also

## 2013-10-19 ENCOUNTER — Ambulatory Visit (INDEPENDENT_AMBULATORY_CARE_PROVIDER_SITE_OTHER): Payer: Medicare Other | Admitting: Ophthalmology

## 2014-01-11 ENCOUNTER — Ambulatory Visit (INDEPENDENT_AMBULATORY_CARE_PROVIDER_SITE_OTHER): Payer: Medicare Other | Admitting: Ophthalmology

## 2014-01-11 DIAGNOSIS — I10 Essential (primary) hypertension: Secondary | ICD-10-CM

## 2014-01-11 DIAGNOSIS — H43819 Vitreous degeneration, unspecified eye: Secondary | ICD-10-CM

## 2014-01-11 DIAGNOSIS — H35039 Hypertensive retinopathy, unspecified eye: Secondary | ICD-10-CM

## 2014-01-11 DIAGNOSIS — H3530 Unspecified macular degeneration: Secondary | ICD-10-CM

## 2014-02-16 ENCOUNTER — Telehealth: Payer: Self-pay | Admitting: Internal Medicine

## 2014-02-16 DIAGNOSIS — R918 Other nonspecific abnormal finding of lung field: Secondary | ICD-10-CM

## 2014-02-16 NOTE — Telephone Encounter (Signed)
LMTCB

## 2014-02-16 NOTE — Telephone Encounter (Signed)
cxr reviewed and the area we've been watching is sltly worse so needs ov in next 4-6 weeks to regroup and develop a longterm plan for rx

## 2014-02-16 NOTE — Telephone Encounter (Signed)
Called spoke with pt. She had CXR 1 week ago at [facility]. Wants to make sure MW is aware of this and received reports. This can be pulled in PACS. She wants to know also based off this does she need to come back and see MW. Please advise thanks

## 2014-02-17 ENCOUNTER — Telehealth: Payer: Self-pay | Admitting: Internal Medicine

## 2014-02-17 NOTE — Telephone Encounter (Signed)
Pt called back and pt stated that she wants to keep the appt in July with MW.  Nothing further is needed.

## 2014-02-17 NOTE — Telephone Encounter (Signed)
Pt aware of results. appt scheduled for 03/13/14 at 1:30. Nothing further needed

## 2014-02-17 NOTE — Telephone Encounter (Signed)
lmomtcb x1 

## 2014-02-17 NOTE — Telephone Encounter (Signed)
Pt returned call, please call back at [phone]

## 2014-02-23 ENCOUNTER — Telehealth: Payer: Self-pay | Admitting: Internal Medicine

## 2014-02-23 NOTE — Telephone Encounter (Signed)
Pt decided to get a sooner appt instead of waiting on 7/6.  Scheduled for 6/19 @ 3:30.  Pt verbalized understanding & states nothing further needed at this time.  Antionette FairyHolly D Pryor

## 2014-02-24 ENCOUNTER — Ambulatory Visit (INDEPENDENT_AMBULATORY_CARE_PROVIDER_SITE_OTHER): Payer: Medicare Other | Admitting: Internal Medicine

## 2014-02-24 ENCOUNTER — Encounter: Payer: Self-pay | Admitting: Internal Medicine

## 2014-02-24 VITALS — BP 150/80 | HR 88 | Temp 98.1°F | Ht 63.5 in | Wt 145.4 lb

## 2014-02-24 DIAGNOSIS — J449 Chronic obstructive pulmonary disease, unspecified: Secondary | ICD-10-CM | POA: Insufficient documentation

## 2014-02-24 DIAGNOSIS — R059 Cough, unspecified: Secondary | ICD-10-CM

## 2014-02-24 DIAGNOSIS — R918 Other nonspecific abnormal finding of lung field: Secondary | ICD-10-CM

## 2014-02-24 DIAGNOSIS — R05 Cough: Secondary | ICD-10-CM

## 2014-02-24 NOTE — Assessment & Plan Note (Signed)
Spirometry 02/24/2014 1.89 (107%) ratio 69  Barely meets criteria for copd s/p remote smoking cessation with most of her refractory symptoms attributable to acei.

## 2014-02-24 NOTE — Progress Notes (Signed)
Subjective:    Patient ID: Dana Blake, female    DOB: 01/05/1927   MRN: [id number]    Brief patient profile:  87 yowf quit smoking around 1986 with resolution of cough and able to yard work abruptly ill 06/25/13 of hoarsness and cough and chest tight then saw UC 06/26/13 and got worse with rx as bronchitis so referred by ED to pulmonary clinic on 07/01/13 -  improved off acei with only GOLD I copd by spirometry 02/24/2014.  History of Present Illness  07/01/2013 1st [facility] office visit/ Dana Blake cc abuptly ill p worked in yard 06/25/13 with persistent hoarseness, severe dry cough, no fever, no sob unless coughing rx with doxy.   Has saba but not using but after neb slt yellow mucus. rec Take delsym two tsp every 12 hours and supplement if needed with  tramadol 50 mg up to 2 every 4 hours to suppress the urge to cough.  Try prilosec 20mg   Take 30-60 min before first meal of the day and Pepcid 20 mg one bedtime until cough is completely gone for at least a week without the need for cough suppression Stop lisinopril Start benicar 20-12.5 one daily break in half if too strong   07/29/2013 f/u ov/Dana Blake re:  MPN's/ cough  Chief Complaint  Patient presents with  . Follow-up    Pt states that her cough has resolved. No new co's today.   Not limited by sob. Some light headedness even on one half benicar rec Stop benicar Start losartan-hct 50/12.5 daily in place of benicar   02/24/2014 f/u ov/Dana Blake re: MPNs/cough / ? Worse RUL density vs baseline cxr  Chief Complaint  Patient presents with  . Follow-up    6 mo follow up.--cxr last week. Pt reports cough is resolved.    Not limited by breathing from desired activities     No obvious day to day or daytime variabilty or assoc chronic cough or cp or chest tightness, subjective wheeze overt sinus or hb symptoms. No unusual exp hx or h/o childhood pna/ asthma or knowledge of premature birth.  Sleeping ok without nocturnal  or early am  exacerbation  of respiratory  c/o's or need for noct saba. Also denies any obvious fluctuation of symptoms with weather or environmental changes or other aggravating or alleviating factors except as outlined above   Current Medications, Allergies, Complete Past Medical History, Past Surgical History, Family History, and Social History were reviewed in [facility] electronic [facility] record.  ROS  The following are not active complaints unless bolded sore throat, dysphagia, dental problems, itching, sneezing,  nasal congestion or excess/ purulent secretions, ear ache,   fever, chills, sweats, unintended wt loss, pleuritic or exertional cp, hemoptysis,  orthopnea pnd or leg swelling, presyncope, palpitations, heartburn, abdominal pain, anorexia, nausea, vomiting, diarrhea  or change in bowel or urinary habits, change in stools or urine, dysuria,hematuria,  rash, arthralgias, visual complaints, headache, numbness weakness or ataxia or problems with walking or coordination,  change in mood/affect or memory.                      Objective:   Physical Exam   amb talkative  wf nad no longer hoarse  02/24/2014        145  Wt Readings from Last 3 Encounters:  07/29/13 146 lb (66.225 kg)  07/01/13 146 lb (66.225 kg)  02/21/13 145 lb (65.772 kg)       HEENT: nl dentition, turbinates,  and orophanx. Nl external ear canals without cough reflex   NECK :  without JVD/Nodes/TM/ nl carotid upstrokes bilaterally   LUNGS: no acc muscle use, clear to A and P bilaterally without cough on insp or exp maneuvers   CV:  RRR  no s3 or murmur or increase in P2, no edema   ABD:  soft and nontender with nl excursion in the supine position. No bruits or organomegaly, bowel sounds nl  MS:  warm without deformities, calf tenderness, cyanosis or clubbing  SKIN: warm and dry without lesions           CT chest  06/30/13 1. The spectrum of findings within the lungs are consistent with a  chronic  atypical indolent infection. 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 Rounded nodule in the right upper lobe does not have the typical  tree in bud configuration of bronchiolitis.  CXR  07/29/2013 :  Mild bibasilar opacities, favored to reflect atelectasis.  cxr 02/09/14 rul density more prominent       Assessment & Plan:

## 2014-02-24 NOTE — Patient Instructions (Addendum)
Please see patient coordinator before you leave today  to schedule CT chest and I will call you with recommendations

## 2014-02-24 NOTE — Assessment & Plan Note (Addendum)
CT chest 06/30/13 The spectrum of findings within the lungs are consistent with a  chronic atypical indolent infection. 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 Rounded nodule in the right upper lobe does not have the typical  tree in bud configuration of bronchiolitis. - cxr 02/09/14 > worse so rec CT next  Discussed in detail all the  indications, usual  risks and alternatives  relative to the benefits with patient who agrees to proceed with CT next then :   Her lung function is surprisingly good so I would favor proceed to excisional bx if showing growth in rul nodular density and leave it up to T surgery whether to do PET first.

## 2014-02-27 NOTE — Progress Notes (Signed)
Quick Note:  Spoke with pt and notified of results per Dr. Wert. Pt verbalized understanding and denied any questions.  ______ 

## 2014-03-02 ENCOUNTER — Encounter: Payer: Self-pay | Admitting: Internal Medicine

## 2014-03-02 ENCOUNTER — Ambulatory Visit (INDEPENDENT_AMBULATORY_CARE_PROVIDER_SITE_OTHER)
Admission: RE | Admit: 2014-03-02 | Discharge: 2014-03-02 | Disposition: A | Discharge status: Home / Self Care | Payer: Medicare Other | Source: Ambulatory Visit | Attending: Internal Medicine | Admitting: Internal Medicine

## 2014-03-02 DIAGNOSIS — R918 Other nonspecific abnormal finding of lung field: Secondary | ICD-10-CM

## 2014-03-02 NOTE — Progress Notes (Signed)
Quick Note:  Spoke with pt and notified of results per Dr. Wert. Pt verbalized understanding and denied any questions.  ______ 

## 2014-03-13 ENCOUNTER — Ambulatory Visit: Payer: Medicare Other | Admitting: Internal Medicine

## 2014-07-06 ENCOUNTER — Telehealth: Payer: Self-pay | Admitting: Internal Medicine

## 2014-07-06 NOTE — Telephone Encounter (Signed)
Per 6/25 CT scan: Notes Recorded by Nyoka CowdenMichael B Wert, MD on 03/02/2014 at 2:09 PM Call patient : Study is improved so rec f/u 3 m and only need to do f/u cxr's from here on --  ATC line busy x 3wcb

## 2014-07-07 NOTE — Telephone Encounter (Signed)
Called and spoke with pt and she is aware of appt with MW.  Nothing further is needed.

## 2014-07-20 ENCOUNTER — Ambulatory Visit: Payer: Medicare Other | Admitting: Internal Medicine

## 2014-07-22 ENCOUNTER — Other Ambulatory Visit: Payer: Self-pay | Admitting: Internal Medicine

## 2014-07-24 ENCOUNTER — Ambulatory Visit (INDEPENDENT_AMBULATORY_CARE_PROVIDER_SITE_OTHER)
Admission: RE | Admit: 2014-07-24 | Discharge: 2014-07-24 | Disposition: A | Discharge status: Home / Self Care | Payer: Medicare Other | Source: Ambulatory Visit | Attending: Internal Medicine | Admitting: Internal Medicine

## 2014-07-24 ENCOUNTER — Encounter: Payer: Self-pay | Admitting: Internal Medicine

## 2014-07-24 ENCOUNTER — Ambulatory Visit (INDEPENDENT_AMBULATORY_CARE_PROVIDER_SITE_OTHER): Payer: Medicare Other | Admitting: Internal Medicine

## 2014-07-24 VITALS — BP 138/76 | HR 84 | Ht 63.0 in | Wt 148.8 lb

## 2014-07-24 DIAGNOSIS — R918 Other nonspecific abnormal finding of lung field: Secondary | ICD-10-CM

## 2014-07-24 NOTE — Progress Notes (Signed)
Subjective:    Patient ID: Dana Blake, female    DOB: 11/24/1926   MRN: [id number]    Brief patient profile:  80 yowf quit smoking around 1986 with resolution of cough and able to yard work abruptly ill 06/25/13 of hoarsness and cough and chest tight then saw UC 06/26/13 and got worse with rx as bronchitis so referred by ED to pulmonary clinic on 07/01/13 -  improved off acei with only GOLD I copd by spirometry 02/24/2014.  History of Present Illness  07/01/2013 1st [facility] office visit/ Eliz Nigg cc abuptly ill p worked in yard 06/25/13 with persistent hoarseness, severe dry cough, no fever, no sob unless coughing rx with doxy.   Has saba but not using but after neb slt yellow mucus. rec Take delsym two tsp every 12 hours and supplement if needed with  tramadol 50 mg up to 2 every 4 hours to suppress the urge to cough.  Try prilosec 20mg   Take 30-60 min before first meal of the day and Pepcid 20 mg one bedtime until cough is completely gone for at least a week without the need for cough suppression Stop lisinopril Start benicar 20-12.5 one daily break in half if too strong   07/29/2013 f/u ov/Gissel Keilman re:  MPN's/ cough  Chief Complaint  Patient presents with  . Follow-up    Pt states that her cough has resolved. No new co's today.   Not limited by sob. Some light headedness even on one half benicar rec Stop benicar Start losartan-hct 50/12.5 daily in place of benicar   02/24/2014 f/u ov/Kenika Sahm re: MPNs/cough / ? Worse RUL density vs baseline cxr  Chief Complaint  Patient presents with  . Follow-up    6 mo follow up.--cxr last week. Pt reports cough is resolved.    Not limited by breathing from desired activities    rec CT chest c/w MAI   07/24/2014 f/u ov/Montel Vanderhoof re: possible MAI  Chief Complaint  Patient presents with  . Follow-up    Pt states that she is doing well and has not had any cough since last visit.    Not limited by breathing from desired activities    No  obvious day to day or daytime variabilty or assoc chronic cough or cp or chest tightness, subjective wheeze overt sinus or hb symptoms. No unusual exp hx or h/o childhood pna/ asthma or knowledge of premature birth.  Sleeping ok without nocturnal  or early am exacerbation  of respiratory  c/o's or need for noct saba. Also denies any obvious fluctuation of symptoms with weather or environmental changes or other aggravating or alleviating factors except as outlined above   Current Medications, Allergies, Complete Past Medical History, Past Surgical History, Family History, and Social History were reviewed in [facility] electronic medical record.  ROS  The following are not active complaints unless bolded sore throat, dysphagia, dental problems, itching, sneezing,  nasal congestion or excess/ purulent secretions, ear ache,   fever, chills, sweats, unintended wt loss, pleuritic or exertional cp, hemoptysis,  orthopnea pnd or leg swelling, presyncope, palpitations, heartburn, abdominal pain, anorexia, nausea, vomiting, diarrhea  or change in bowel or urinary habits, change in stools or urine, dysuria,hematuria,  rash, arthralgias, visual complaints, headache, numbness weakness or ataxia or problems with walking or coordination,  change in mood/affect or memory.                 Objective:   Physical Exam   amb talkative  wf  nad no longer hoarse looks much younger than stated age   02/24/2014        145   > 07/24/2014  149  Wt Readings from Last 3 Encounters:  07/29/13 146 lb (66.225 kg)  07/01/13 146 lb (66.225 kg)  02/21/13 145 lb (65.772 kg)       HEENT: nl dentition, turbinates, and orophanx. Nl external ear canals without cough reflex   NECK :  without JVD/Nodes/TM/ nl carotid upstrokes bilaterally   LUNGS: no acc muscle use, clear to A and P bilaterally without cough on insp or exp maneuvers   CV:  RRR  no s3 or murmur or increase in P2, no edema   ABD:  soft and nontender  with nl excursion in the supine position. No bruits or organomegaly, bowel sounds nl  MS:  warm without deformities, calf tenderness, cyanosis or clubbing  SKIN: warm and dry without lesions           CT chest  06/30/13 1. The spectrum of findings within the lungs are consistent with a  chronic atypical indolent infection. 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 Rounded nodule in the right upper lobe does not have the typical  tree in bud configuration of bronchiolitis.    CXR  07/24/2014 : Questionable progression of pulmonary nodules lateral aspect left lung. Pulmonary nodules and parenchymal changes are better demonstrated on CT         Assessment & Plan:

## 2014-07-24 NOTE — Patient Instructions (Addendum)
Please remember to go to the   x-ray department downstairs for your tests - we will call you with the results when they are available.  MAI ( Mycobacterium Avium  Intracellulare) is a common colonizer and does not appear to be causing any active clinical infection so is best not treated  Unless there is convincing evidence of chronic respiratory infection (persistent productive, worse exercise tolerance, or fever/sweats of unknown origin no need for additional cxr's or follow up here.

## 2014-07-25 NOTE — Assessment & Plan Note (Signed)
CT chest 06/30/13 The spectrum of findings within the lungs are consistent with a  chronic atypical indolent infection. 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 Rounded nodule in the right upper lobe does not have the typical  tree in bud configuration of bronchiolitis. - cxr 02/09/14 > increased density  RUL> repeat CT chest 03/02/2014 > nodule resolved, MAI changes persist - f/u cxr 07/24/2014  No RUL nodules, now ? Tiny L lateral nodules   I had an extended discussion with the patient  today lasting 15 to 20 minutes of a 25 minute visit on the following issues:   Discussed in detail all the  indications, usual  risks and alternatives  relative to the benefits with patient who agrees to proceed with conservative f/u only looking for any evidence at all of dz progression clinically and correlation with convincing grossly obvious changes on plain film (no further ct's needed)

## 2014-07-25 NOTE — Progress Notes (Signed)
Quick Note:  ATC, NA and no VM ______

## 2014-07-26 ENCOUNTER — Telehealth: Payer: Self-pay | Admitting: Internal Medicine

## 2014-07-26 NOTE — Progress Notes (Signed)
Quick Note:  LMTCB ______ 

## 2014-07-26 NOTE — Telephone Encounter (Signed)
Spoke with the pt and notified of her cxr results  She verbalized understanding  Nothing further needed 

## 2014-07-26 NOTE — Progress Notes (Signed)
Quick Note:  Spoke with pt and notified of results per Dr. Wert. Pt verbalized understanding and denied any questions.  ______ 

## 2014-07-26 NOTE — Telephone Encounter (Signed)
LMTCB

## 2014-08-20 ENCOUNTER — Other Ambulatory Visit: Payer: Self-pay | Admitting: Internal Medicine

## 2014-09-17 ENCOUNTER — Other Ambulatory Visit: Payer: Self-pay | Admitting: Internal Medicine

## 2014-10-15 ENCOUNTER — Other Ambulatory Visit: Payer: Self-pay | Admitting: Internal Medicine

## 2014-10-20 ENCOUNTER — Other Ambulatory Visit: Payer: Self-pay | Admitting: Internal Medicine

## 2014-10-23 ENCOUNTER — Other Ambulatory Visit: Payer: Self-pay | Admitting: Internal Medicine

## 2014-10-24 ENCOUNTER — Other Ambulatory Visit: Payer: Self-pay | Admitting: Internal Medicine

## 2014-10-24 ENCOUNTER — Telehealth: Payer: Self-pay | Admitting: Internal Medicine

## 2014-10-24 MED ORDER — LOSARTAN POTASSIUM-HCTZ 50-12.5 MG PO TABS: 1.0000 | ORAL_TABLET | Freq: Every day | ORAL | Status: DC

## 2014-10-24 NOTE — Telephone Encounter (Signed)
Pt is needing refill on Hyzaar. She didn't know that her PCP was the one's needing to refill this. Would like for us to refill one more time so that he may pick this up today. Rx has been sent in. Nothing further was needed.

## 2014-11-20 ENCOUNTER — Other Ambulatory Visit: Payer: Self-pay | Admitting: Internal Medicine

## 2014-11-25 IMAGING — CR DG CHEST 2V
2 series · 2 of 2 positions shown · non-contrast
Comparison: 03/02/2014 06/30/2013 CT.  02/09/2014 chest x-ray.

CLINICAL DATA: 87-year-old hypertensive female with pulmonary
nodules. Subsequent encounter.

EXAM:
CHEST  2 VIEW

[view not recorded (1 of 2)]
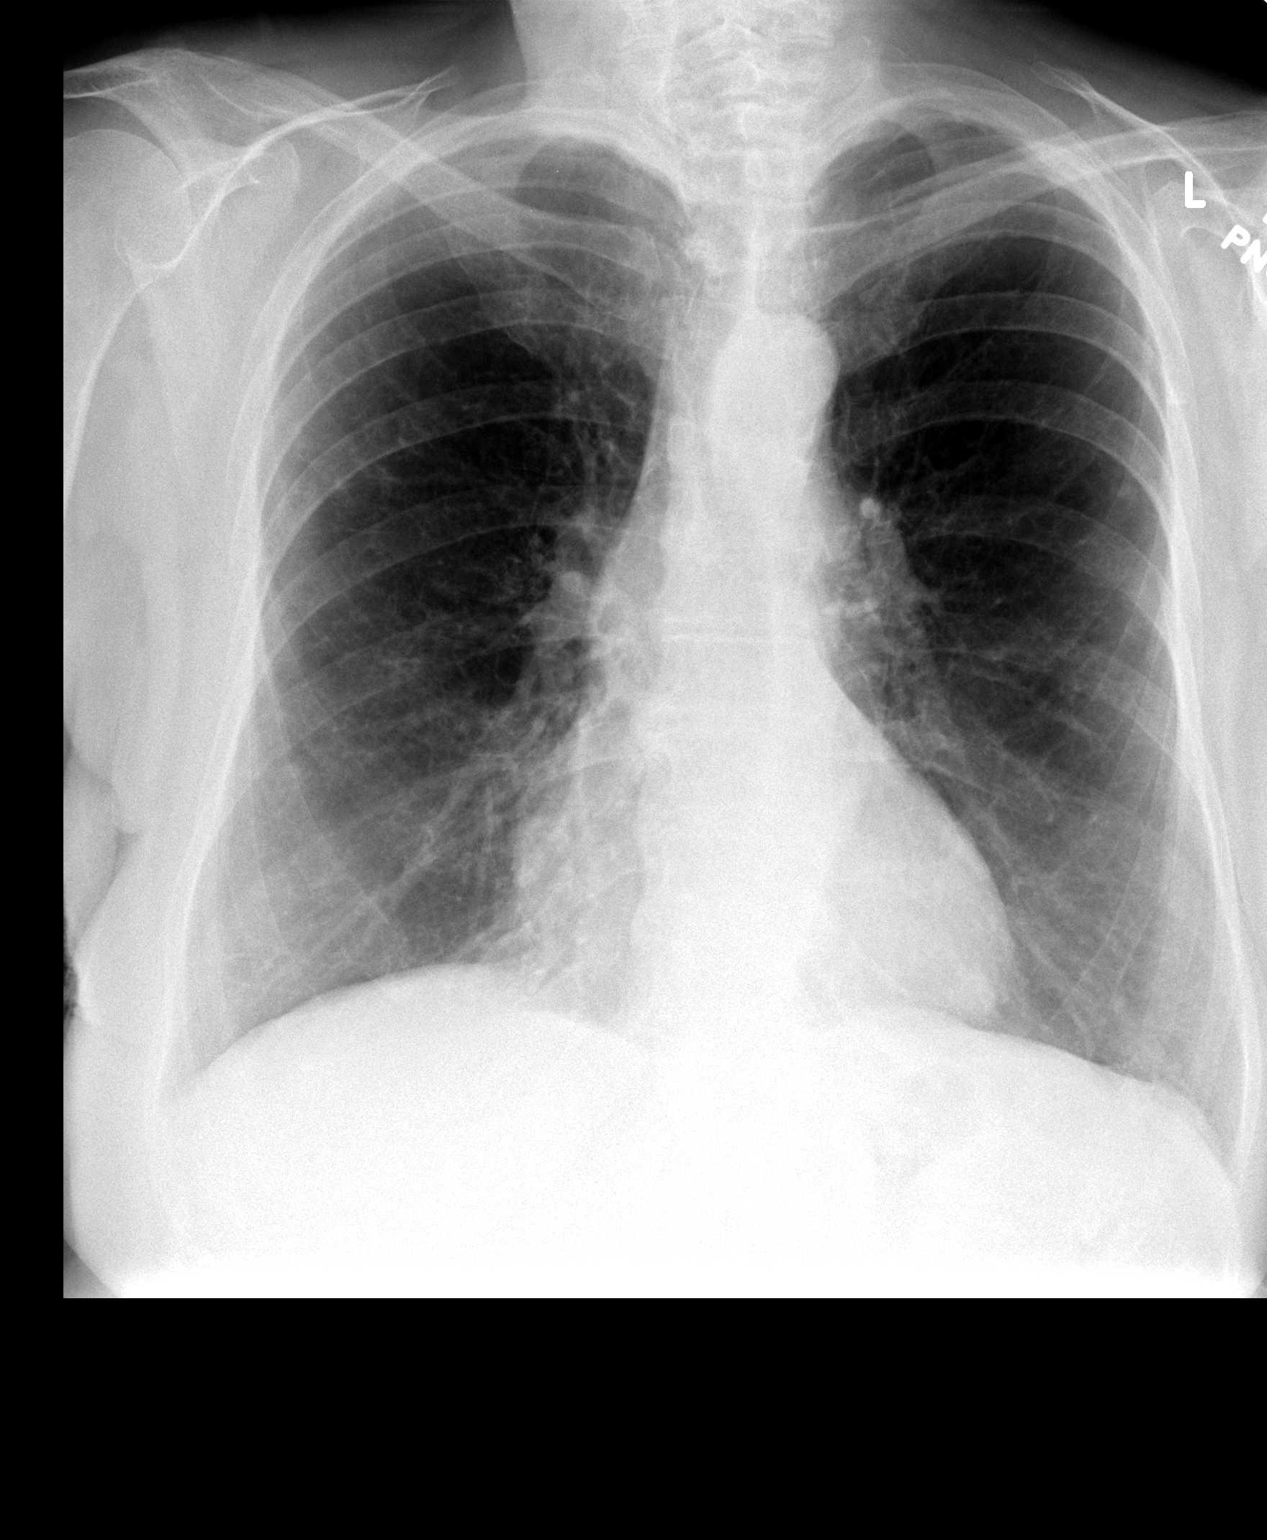

[view not recorded (2 of 2)]
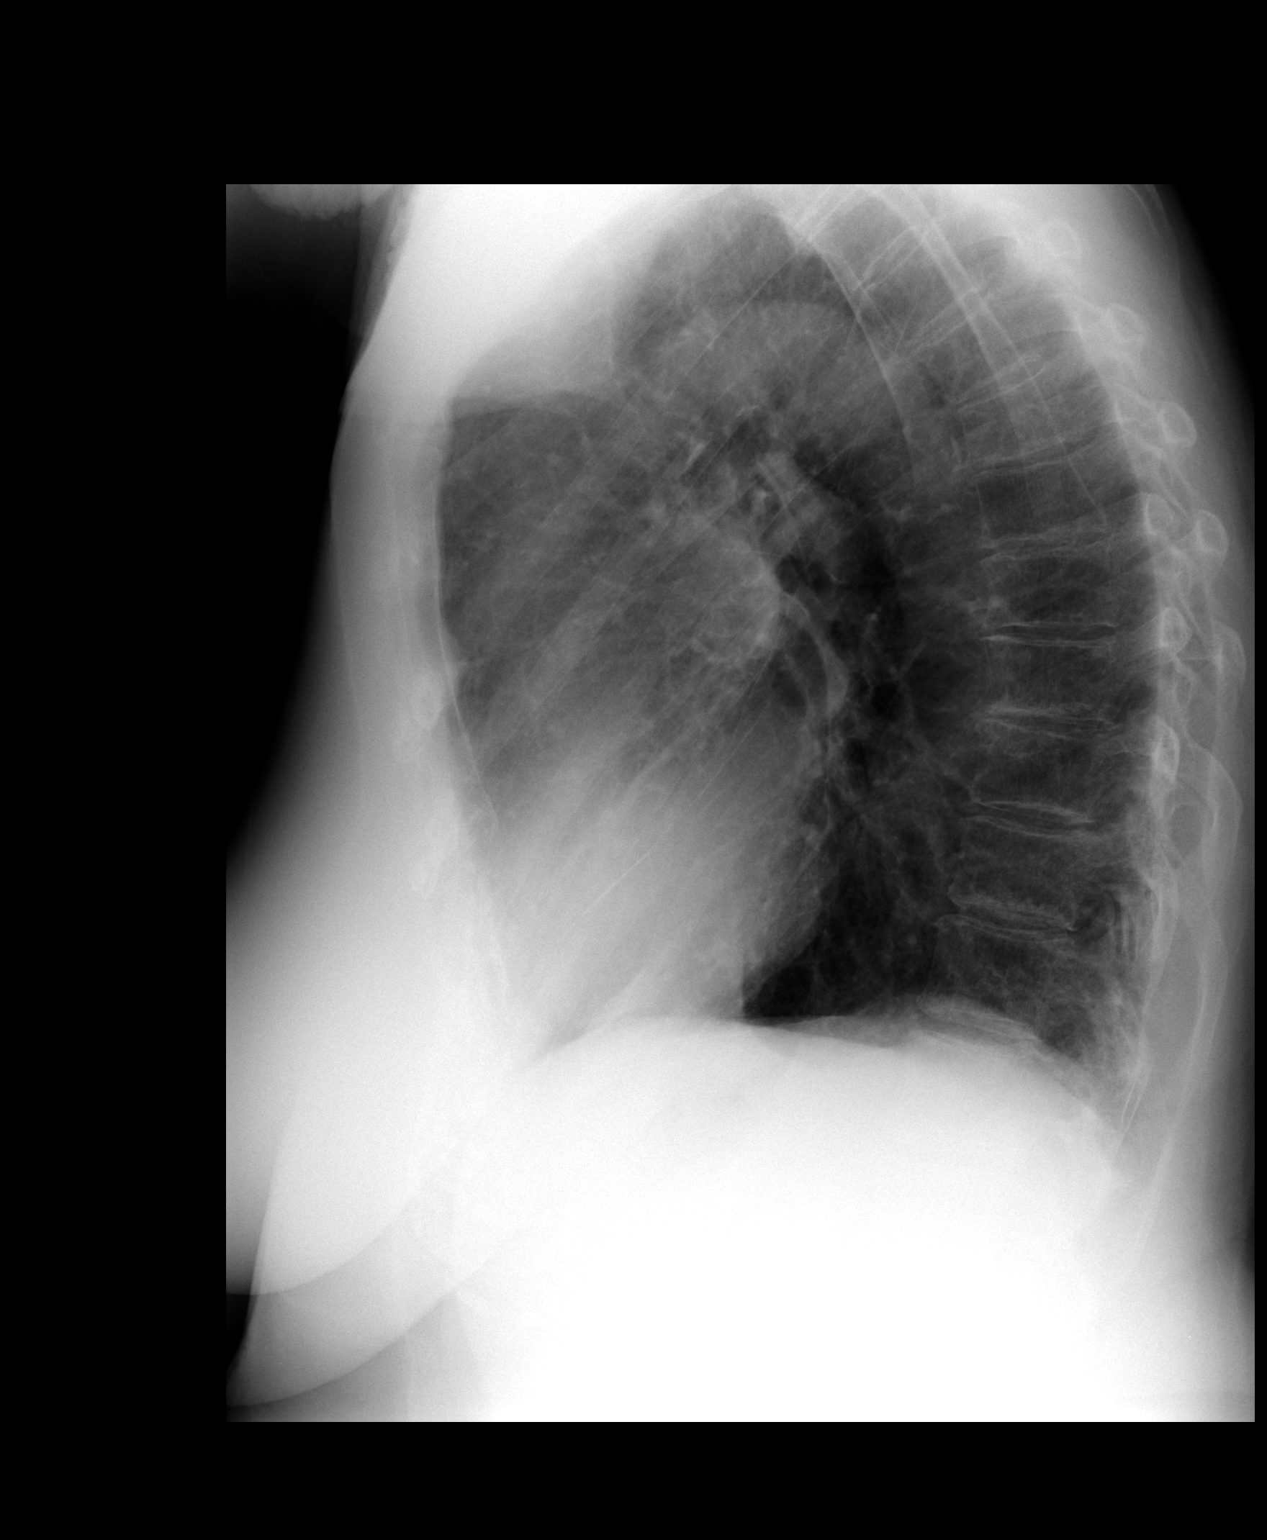

[2 of 2 positions shown; findings below may reference images not displayed]

FINDINGS: Questionable progression of pulmonary nodules lateral aspect left
lung. Pulmonary nodules and parenchymal changes are better
demonstrated on CT. If further evaluation is clinically desired,
unenhanced CT may then be considered.

Calcified mildly tortuous aorta.

Heart size within normal limits.
IMPRESSION: Questionable progression of pulmonary nodules lateral aspect left
lung. Pulmonary nodules and parenchymal changes are better
demonstrated on CT. If further evaluation is clinically desired,
unenhanced CT may then be considered.

## 2014-11-28 ENCOUNTER — Other Ambulatory Visit: Payer: Self-pay

## 2014-11-28 ENCOUNTER — Telehealth: Payer: Self-pay | Admitting: Internal Medicine

## 2014-11-28 DIAGNOSIS — Z1231 Encounter for screening mammogram for malignant neoplasm of breast: Secondary | ICD-10-CM

## 2014-11-28 NOTE — Telephone Encounter (Signed)
Dr Thurmon Fairshoemaker's group is very good

## 2014-11-28 NOTE — Telephone Encounter (Signed)
Pt's daughter, Lebron Quamshlyn would like MW's recommendation on a good ENT in town.  MW - please advise. Thanks.

## 2014-11-28 NOTE — Telephone Encounter (Signed)
lmtcb for pt's daughter, Lebron Quamshlyn.

## 2014-11-29 NOTE — Telephone Encounter (Signed)
Pt grand daughter returning call .Dana Blake ° °

## 2014-11-29 NOTE — Telephone Encounter (Signed)
Spoke with pt and advised that Dr Thurmon FairShoemaker's group is who Dr wert recommends.  Pt verbalized understanding and nothing further is needed.

## 2014-11-29 NOTE — Telephone Encounter (Signed)
Lmtcb for Kerr-McGeeshlyn.

## 2014-12-05 ENCOUNTER — Ambulatory Visit
Admission: RE | Admit: 2014-12-05 | Discharge: 2014-12-05 | Disposition: A | Discharge status: Home / Self Care | Payer: Medicare Other | Source: Ambulatory Visit

## 2014-12-05 DIAGNOSIS — Z1231 Encounter for screening mammogram for malignant neoplasm of breast: Secondary | ICD-10-CM

## 2014-12-25 ENCOUNTER — Emergency Department (HOSPITAL_COMMUNITY)
Admission: EM | Admit: 2014-12-25 | Discharge: 2014-12-25 | Disposition: A | Discharge status: Home / Self Care | Payer: Medicare Other | Attending: Emergency Medicine | Admitting: Emergency Medicine

## 2014-12-25 ENCOUNTER — Encounter (HOSPITAL_COMMUNITY): Payer: Self-pay | Admitting: Emergency Medicine

## 2014-12-25 DIAGNOSIS — H9193 Unspecified hearing loss, bilateral: Secondary | ICD-10-CM | POA: Insufficient documentation

## 2014-12-25 DIAGNOSIS — Z8639 Personal history of other endocrine, nutritional and metabolic disease: Secondary | ICD-10-CM | POA: Insufficient documentation

## 2014-12-25 DIAGNOSIS — I10 Essential (primary) hypertension: Secondary | ICD-10-CM | POA: Diagnosis not present

## 2014-12-25 DIAGNOSIS — R42 Dizziness and giddiness: Secondary | ICD-10-CM | POA: Insufficient documentation

## 2014-12-25 DIAGNOSIS — Z79899 Other long term (current) drug therapy: Secondary | ICD-10-CM | POA: Insufficient documentation

## 2014-12-25 LAB — COMPREHENSIVE METABOLIC PANEL
ALBUMIN: 3.8 g/dL (ref 3.5–5.2)
ALK PHOS: 90 U/L (ref 39–117)
ALT: 14 U/L (ref 0–35)
AST: 17 U/L (ref 0–37)
Anion gap: 6 (ref 5–15)
BUN: 20 mg/dL (ref 6–23)
CHLORIDE: 103 mmol/L (ref 96–112)
CO2: 29 mmol/L (ref 19–32)
Calcium: 8.7 mg/dL (ref 8.4–10.5)
Creatinine, Ser: 0.64 mg/dL (ref 0.50–1.10)
GFR calc Af Amer: >90 mL/min (ref >90)
GFR calc non Af Amer: 79 mL/min — ABNORMAL LOW (ref >90)
Glucose, Bld: 92 mg/dL (ref 70–99)
POTASSIUM: 4.2 mmol/L (ref 3.5–5.1)
SODIUM: 138 mmol/L (ref 135–145)
Total Bilirubin: 0.6 mg/dL (ref 0.3–1.2)
Total Protein: 7.2 g/dL (ref 6.0–8.3)

## 2014-12-25 LAB — CBC WITH DIFFERENTIAL/PLATELET
BASOS ABS: 0 10*3/uL (ref 0.0–0.1)
Basophils Relative: 0 % (ref 0–1)
EOS ABS: 0.2 10*3/uL (ref 0.0–0.7)
Eosinophils Relative: 2 % (ref 0–5)
HCT: 39.4 % (ref 36.0–46.0)
Hemoglobin: 12.7 g/dL (ref 12.0–15.0)
LYMPHS ABS: 2.8 10*3/uL (ref 0.7–4.0)
Lymphocytes Relative: 22 % (ref 12–46)
MCH: 28.1 pg (ref 26.0–34.0)
MCHC: 32.2 g/dL (ref 30.0–36.0)
MCV: 87.2 fL (ref 78.0–100.0)
MONOS PCT: 10 % (ref 3–12)
Monocytes Absolute: 1.2 10*3/uL — ABNORMAL HIGH (ref 0.1–1.0)
NEUTROS ABS: 8.4 10*3/uL — AB (ref 1.7–7.7)
Neutrophils Relative %: 66 % (ref 43–77)
Platelets: 348 10*3/uL (ref 150–400)
RBC: 4.52 MIL/uL (ref 3.87–5.11)
RDW: 13.2 % (ref 11.5–15.5)
WBC: 12.5 10*3/uL — ABNORMAL HIGH (ref 4.0–10.5)

## 2014-12-25 LAB — URINALYSIS, ROUTINE W REFLEX MICROSCOPIC
Bilirubin Urine: NEGATIVE
GLUCOSE, UA: NEGATIVE mg/dL
Ketones, ur: NEGATIVE mg/dL
LEUKOCYTES UA: NEGATIVE
Nitrite: NEGATIVE
PROTEIN: NEGATIVE mg/dL
SPECIFIC GRAVITY, URINE: 1.017 (ref 1.005–1.030)
Urobilinogen, UA: 0.2 mg/dL (ref 0.0–1.0)
pH: 5.5 (ref 5.0–8.0)

## 2014-12-25 LAB — URINE MICROSCOPIC-ADD ON

## 2014-12-25 LAB — I-STAT CG4 LACTIC ACID, ED: Lactic Acid, Venous: 0.9 mmol/L (ref 0.5–2.0)

## 2014-12-25 MED ORDER — MECLIZINE HCL 12.5 MG PO TABS: 12.5000 mg | ORAL_TABLET | Freq: Three times a day (TID) | ORAL | Status: AC

## 2014-12-25 MED ORDER — MECLIZINE HCL 25 MG PO TABS
25.0000 mg | ORAL_TABLET | Freq: Once | ORAL | Status: AC
  Administered 2014-12-25: 25 mg via ORAL
  Filled 2014-12-25: qty 1

## 2014-12-25 MED ORDER — SODIUM CHLORIDE 0.9 % IV BOLUS (SEPSIS)
500.0000 mL | Freq: Once | INTRAVENOUS | Status: AC
  Administered 2014-12-25: 500 mL via INTRAVENOUS

## 2014-12-25 NOTE — ED Notes (Signed)
Pt states that in the mornings when she wakes up she will get dizzy when she first gets up but will go away after she is up walking around for little bit and also during the night when she wakes up to go to the bathroom.  Pt states that she had this problem couple months ago and put on claritin by her PCP.

## 2014-12-25 NOTE — ED Provider Notes (Signed)
CSN: [id number]     Arrival date & time 12/25/14  1109 History   First MD Initiated Contact with Patient 12/25/14 1141     Chief Complaint  Patient presents with  . Dizziness     (Consider location/radiation/quality/duration/timing/severity/associated sxs/prior Treatment) HPI Patient was also considered episodic dizziness. For 3 days the patient has had dizziness upon awakening. The dizziness stops after she is up and about in the first hours of the day. Prior to going to sleep, she is in her usual state of health. There is no concurrent chest pain, dyspnea, headache, visual changes. Patient has macular degeneration, no changes. No ear pain, hearing loss, though she has generally poor hearing. Patient had one prior similar episode, she related to seasonal allergies. Patient has no recent echocardiogram, carotid evaluation.  Past Medical History  Diagnosis Date  . Hypertension   . Hyperlipidemia   . Macular degeneration    Past Surgical History  Procedure Laterality Date  . Appendectomy     No family history on file. History  Substance Use Topics  . Smoking status: Former Smoker -- 1.00 packs/day for 33 years    Types: Cigarettes  . Smokeless tobacco: Never Used  . Alcohol Use: No   OB History    No data available     Review of Systems  Constitutional:       Per HPI, otherwise negative  HENT:       Per HPI, otherwise negative  Respiratory:       Per HPI, otherwise negative  Cardiovascular:       Per HPI, otherwise negative  Gastrointestinal: Negative for vomiting.  Endocrine:       Negative aside from HPI  Genitourinary:       Neg aside from HPI   Musculoskeletal:       Per HPI, otherwise negative  Skin: Negative.   Neurological: Positive for dizziness. Negative for syncope, weakness, light-headedness and headaches.      Allergies  Prednisone  Home Medications   Prior to Admission medications   Medication Sig Start Date End Date Taking?  Authorizing Provider  albuterol (PROVENTIL HFA;VENTOLIN HFA) 108 (90 BASE) MCG/ACT inhaler Inhale 1-2 puffs into the lungs every 6 (six) hours as needed for wheezing. 06/30/13  Yes Kaitlyn Szekalski, PA-C  beta carotene w/minerals (OCUVITE) tablet Take 1-2 tablets by mouth daily.   Yes Historical Provider, MD  Cholecalciferol (VITAMIN D PO) Take 1 tablet by mouth daily.   Yes Historical Provider, MD  ibuprofen (ADVIL,MOTRIN) 200 MG tablet Take 400 mg by mouth every 6 (six) hours as needed for headache or moderate pain.   Yes Historical Provider, MD  latanoprost (XALATAN) 0.005 % ophthalmic solution Place 1 drop into both eyes at bedtime.    Yes Historical Provider, MD  loratadine (CLARITIN) 10 MG tablet Take 10 mg by mouth daily.   Yes Historical Provider, MD  losartan-hydrochlorothiazide (HYZAAR) 50-12.5 MG per tablet Take 1 tablet by mouth daily. 10/24/14  Yes Nyoka CowdenMichael B Wert, MD  sodium chloride (OCEAN) 0.65 % nasal spray Place 1 spray into the nose daily as needed for congestion.   Yes Historical Provider, MD  meclizine (ANTIVERT) 12.5 MG tablet Take 1 tablet (12.5 mg total) by mouth 3 (three) times daily. 12/25/14 01/01/15  Gerhard Munchobert Nas Wafer, MD   BP 123/56 mmHg  Pulse 64  Temp(Src) 97.8 F (36.6 C) (Oral)  Resp 18  SpO2 97% Physical Exam  Constitutional: She is oriented to person, place, and time. She appears well-developed  and well-nourished. No distress.  HENT:  Head: Normocephalic and atraumatic.  Right Ear: Tympanic membrane normal. No drainage, swelling or tenderness. Decreased hearing is noted.  Left Ear: Tympanic membrane normal. No drainage, swelling or tenderness. Decreased hearing is noted.  Eyes: Conjunctivae and EOM are normal.  Cardiovascular: Normal rate and regular rhythm.   Pulmonary/Chest: Effort normal and breath sounds normal. No stridor. No respiratory distress.  Abdominal: She exhibits no distension.  Musculoskeletal: She exhibits no edema.  Neurological: She is alert  and oriented to person, place, and time. No cranial nerve deficit.  Skin: Skin is warm and dry.  Psychiatric: She has a normal mood and affect.  Nursing note and vitals reviewed.   ED Course  Procedures (including critical care time) Labs Review Labs Reviewed  COMPREHENSIVE METABOLIC PANEL - Abnormal; Notable for the following:    GFR calc non Af Amer 79 (*)    All other components within normal limits  CBC WITH DIFFERENTIAL/PLATELET - Abnormal; Notable for the following:    WBC 12.5 (*)    Neutro Abs 8.4 (*)    Monocytes Absolute 1.2 (*)    All other components within normal limits  URINALYSIS, ROUTINE W REFLEX MICROSCOPIC - Abnormal; Notable for the following:    Hgb urine dipstick TRACE (*)    All other components within normal limits  URINE MICROSCOPIC-ADD ON  I-STAT CG4 LACTIC ACID, ED      EKG Interpretation   Date/Time:  Monday December 25 2014 12:28:41 EDT Ventricular Rate:  67 PR Interval:  169 QRS Duration: 89 QT Interval:  430 QTC Calculation: 454 R Axis:   -28 Text Interpretation:  Sinus rhythm Atrial premature complex Borderline  left axis deviation Low voltage, extremity and precordial leads Consider  anterior infarct Baseline wander in lead(s) V3 Sinus rhythm Artifact Low  voltage QRS Abnormal ekg Confirmed by Gerhard Munch  MD ([id number]) on  12/25/2014 1:17:13 PM     Pulse ox 100% room air normal  I reviewed the results (including imaging as performed), agree with the interpretation  On repeat exam the patient appears better.  We reviewed all findings.   MDM   Final diagnoses:  Dizziness   Patient presents with new episodic dizziness. Patient has had similar prior episodes, but has no long history of vertigo. Here patient is a symptomatically, and neurologically has no deficits. Given the description of pain are normal episodes, there suspicion for either low-flow state, versus inner ear dysfunction. No indication for emergent imaging, though the  patient was referred to both neurology and primary care for further evaluation, of carotid arteries, echocardiogram.    Gerhard Munch, MD 12/25/14 1436

## 2014-12-25 NOTE — ED Notes (Signed)
Awake. Verbally responsive. A/O x4. Resp even and unlabored. No audible adventitious breath sounds noted. ABC's intact.  

## 2014-12-25 NOTE — ED Notes (Signed)
Awake. Verbally responsive. A/O x4. Resp even and unlabored. No audible adventitious breath sounds noted. ABC's intact. SR on monitor. Pt ambulated to BR with steady gait. Denies dizziness at this time.

## 2014-12-25 NOTE — ED Notes (Signed)
Awake. Verbally responsive. A/O x4. Resp even and unlabored. No audible adventitious breath sounds noted. ABC's intact. SR on monitor. IV patent and intact. Family at bedside.

## 2014-12-25 NOTE — Discharge Instructions (Signed)
As discussed, today's evaluation has been largely reassuring. Your episodes of dizziness are likely due to one of several mechanisms. It is important that you follow-up with both your primary care physician and our neurologists for further evaluation.  Return here for concerning changes in your condition.  With your cardiologist, please discuss carotid Doppler ultrasound, as well as echocardiogram.  With the neurologists, please discuss additional evaluation of your episodic dizziness.   Dizziness Dizziness is a common problem. It is a feeling of unsteadiness or light-headedness. You may feel like you are about to faint. Dizziness can lead to injury if you stumble or fall. A person of any age group can suffer from dizziness, but dizziness is more common in older adults. CAUSES  Dizziness can be caused by many different things, including:  Middle ear problems.  Standing for too long.  Infections.  An allergic reaction.  Aging.  An emotional response to something, such as the sight of blood.  Side effects of medicines.  Tiredness.  Problems with circulation or blood pressure.  Excessive use of alcohol or medicines, or illegal drug use.  Breathing too fast (hyperventilation).  An irregular heart rhythm (arrhythmia).  A low red blood cell count (anemia).  Pregnancy.  Vomiting, diarrhea, fever, or other illnesses that cause body fluid loss (dehydration).  Diseases or conditions such as Parkinson's disease, high blood pressure (hypertension), diabetes, and thyroid problems.  Exposure to extreme heat. DIAGNOSIS  Your health care provider will ask about your symptoms, perform a physical exam, and perform an electrocardiogram (ECG) to record the electrical activity of your heart. Your health care provider may also perform other heart or blood tests to determine the cause of your dizziness. These may include:  Transthoracic echocardiogram (TTE). During echocardiography,  sound waves are used to evaluate how blood flows through your heart.  Transesophageal echocardiogram (TEE).  Cardiac monitoring. This allows your health care provider to monitor your heart rate and rhythm in real time.  Holter monitor. This is a portable device that records your heartbeat and can help diagnose heart arrhythmias. It allows your health care provider to track your heart activity for several days if needed.  Stress tests by exercise or by giving medicine that makes the heart beat faster. TREATMENT  Treatment of dizziness depends on the cause of your symptoms and can vary greatly. HOME CARE INSTRUCTIONS   Drink enough fluids to keep your urine clear or pale yellow. This is especially important in very hot weather. In older adults, it is also important in cold weather.  Take your medicine exactly as directed if your dizziness is caused by medicines. When taking blood pressure medicines, it is especially important to get up slowly.  Rise slowly from chairs and steady yourself until you feel okay.  In the morning, first sit up on the side of the bed. When you feel okay, stand slowly while holding onto something until you know your balance is fine.  Move your legs often if you need to stand in one place for a long time. Tighten and relax your muscles in your legs while standing.  Have someone stay with you for 1-2 days if dizziness continues to be a problem. Do this until you feel you are well enough to stay alone. Have the person call your health care provider if he or she notices changes in you that are concerning.  Do not drive or use heavy machinery if you feel dizzy.  Do not drink alcohol. SEEK IMMEDIATE MEDICAL CARE  IF:   Your dizziness or light-headedness gets worse.  You feel nauseous or vomit.  You have problems talking, walking, or using your arms, hands, or legs.  You feel weak.  You are not thinking clearly or you have trouble forming sentences. It may take a  friend or family member to notice this.  You have chest pain, abdominal pain, shortness of breath, or sweating.  Your vision changes.  You notice any bleeding.  You have side effects from medicine that seems to be getting worse rather than better. MAKE SURE YOU:   Understand these instructions.  Will watch your condition.  Will get help right away if you are not doing well or get worse. Document Released: 02/18/2001 Document Revised: 08/30/2013 Document Reviewed: 03/14/2011 [facility] Patient Information 2015 [place], [place]. This information is not intended to replace advice given to you by your health care provider. Make sure you discuss any questions you have with your health care provider.

## 2014-12-25 NOTE — ED Notes (Signed)
Pt reported having dizziness for past few days. Dizziness worse upon rising. Pt reported having nausea. Pt denies visual disturbances.

## 2014-12-29 ENCOUNTER — Other Ambulatory Visit: Payer: Self-pay | Admitting: Family Medicine

## 2014-12-29 DIAGNOSIS — R42 Dizziness and giddiness: Secondary | ICD-10-CM

## 2015-01-02 ENCOUNTER — Other Ambulatory Visit (HOSPITAL_COMMUNITY): Payer: Self-pay | Admitting: Emergency Medicine

## 2015-01-02 ENCOUNTER — Ambulatory Visit
Admission: RE | Admit: 2015-01-02 | Discharge: 2015-01-02 | Disposition: A | Discharge status: Home / Self Care | Payer: Medicare Other | Source: Ambulatory Visit | Attending: Family Medicine | Admitting: Family Medicine

## 2015-01-02 DIAGNOSIS — R42 Dizziness and giddiness: Secondary | ICD-10-CM

## 2015-01-03 ENCOUNTER — Ambulatory Visit (HOSPITAL_COMMUNITY): Payer: Medicare Other | Attending: Cardiology

## 2015-01-03 ENCOUNTER — Other Ambulatory Visit (HOSPITAL_COMMUNITY): Payer: Self-pay | Admitting: Family Medicine

## 2015-01-03 DIAGNOSIS — G459 Transient cerebral ischemic attack, unspecified: Secondary | ICD-10-CM | POA: Diagnosis not present

## 2015-01-03 DIAGNOSIS — R42 Dizziness and giddiness: Secondary | ICD-10-CM | POA: Diagnosis present

## 2015-01-03 NOTE — Progress Notes (Signed)
2D Echo completed. 01/03/2015 

## 2015-01-04 ENCOUNTER — Ambulatory Visit: Payer: Self-pay | Admitting: Neurology

## 2015-01-29 ENCOUNTER — Ambulatory Visit (INDEPENDENT_AMBULATORY_CARE_PROVIDER_SITE_OTHER): Payer: Medicare Other | Admitting: Ophthalmology

## 2015-01-29 DIAGNOSIS — H35033 Hypertensive retinopathy, bilateral: Secondary | ICD-10-CM | POA: Diagnosis not present

## 2015-01-29 DIAGNOSIS — I10 Essential (primary) hypertension: Secondary | ICD-10-CM | POA: Diagnosis not present

## 2015-01-29 DIAGNOSIS — H43813 Vitreous degeneration, bilateral: Secondary | ICD-10-CM | POA: Diagnosis not present

## 2015-01-29 DIAGNOSIS — H3532 Exudative age-related macular degeneration: Secondary | ICD-10-CM

## 2015-04-20 ENCOUNTER — Encounter (HOSPITAL_COMMUNITY): Payer: Self-pay | Admitting: Emergency Medicine

## 2015-04-20 ENCOUNTER — Emergency Department (HOSPITAL_COMMUNITY)
Admission: EM | Admit: 2015-04-20 | Discharge: 2015-04-20 | Disposition: A | Discharge status: Home / Self Care | Payer: Medicare Other | Attending: Emergency Medicine | Admitting: Emergency Medicine

## 2015-04-20 DIAGNOSIS — Z79899 Other long term (current) drug therapy: Secondary | ICD-10-CM | POA: Insufficient documentation

## 2015-04-20 DIAGNOSIS — Z87891 Personal history of nicotine dependence: Secondary | ICD-10-CM | POA: Diagnosis not present

## 2015-04-20 DIAGNOSIS — I10 Essential (primary) hypertension: Secondary | ICD-10-CM | POA: Diagnosis not present

## 2015-04-20 DIAGNOSIS — M545 Low back pain, unspecified: Secondary | ICD-10-CM

## 2015-04-20 DIAGNOSIS — E785 Hyperlipidemia, unspecified: Secondary | ICD-10-CM | POA: Insufficient documentation

## 2015-04-20 DIAGNOSIS — Z8669 Personal history of other diseases of the nervous system and sense organs: Secondary | ICD-10-CM | POA: Diagnosis not present

## 2015-04-20 LAB — URINALYSIS, ROUTINE W REFLEX MICROSCOPIC
Bilirubin Urine: NEGATIVE
GLUCOSE, UA: NEGATIVE mg/dL
Hgb urine dipstick: NEGATIVE
Ketones, ur: NEGATIVE mg/dL
Leukocytes, UA: NEGATIVE
Nitrite: NEGATIVE
Protein, ur: NEGATIVE mg/dL
SPECIFIC GRAVITY, URINE: 1.008 (ref 1.005–1.030)
Urobilinogen, UA: 0.2 mg/dL (ref 0.0–1.0)
pH: 7.5 (ref 5.0–8.0)

## 2015-04-20 MED ORDER — MORPHINE SULFATE 4 MG/ML IJ SOLN
4.0000 mg | Freq: Once | INTRAMUSCULAR | Status: AC
  Administered 2015-04-20: 4 mg via INTRAMUSCULAR
  Filled 2015-04-20: qty 1

## 2015-04-20 MED ORDER — DIAZEPAM 2 MG PO TABS: 2.0000 mg | ORAL_TABLET | Freq: Four times a day (QID) | ORAL | Status: DC | PRN

## 2015-04-20 MED ORDER — OXYCODONE-ACETAMINOPHEN 5-325 MG PO TABS: 2.0000 | ORAL_TABLET | ORAL | Status: DC | PRN

## 2015-04-20 MED ORDER — DIAZEPAM 5 MG PO TABS
5.0000 mg | ORAL_TABLET | Freq: Once | ORAL | Status: AC
  Administered 2015-04-20: 5 mg via ORAL
  Filled 2015-04-20: qty 1

## 2015-04-20 NOTE — ED Provider Notes (Signed)
CSN: [id number]     Arrival date & time 04/20/15  1044 History   First MD Initiated Contact with Patient 04/20/15 1050     Chief Complaint  Patient presents with  . Back Pain     (Consider location/radiation/quality/duration/timing/severity/associated sxs/prior Treatment) Patient is a 81 y.o. female presenting with back pain. The history is provided by the patient.  Back Pain Location:  Lumbar spine Quality:  Stiffness Stiffness is present:  Unable to specify Radiates to:  Does not radiate Pain severity:  Moderate Pain is:  Same all the time Onset quality:  Gradual Duration:  3 days Timing:  Constant Progression:  Worsening Chronicity:  New Relieved by:  Nothing Worsened by:  Movement Ineffective treatments:  OTC medications Associated symptoms: no bladder incontinence, no bowel incontinence, no dysuria, no fever, no numbness, no paresthesias and no weakness     Past Medical History  Diagnosis Date  . Hypertension   . Hyperlipidemia   . Macular degeneration    Past Surgical History  Procedure Laterality Date  . Appendectomy     No family history on file. Social History  Substance Use Topics  . Smoking status: Former Smoker -- 1.00 packs/day for 33 years    Types: Cigarettes  . Smokeless tobacco: Never Used  . Alcohol Use: No   OB History    No data available     Review of Systems  Constitutional: Negative for fever.  Gastrointestinal: Negative for bowel incontinence.  Genitourinary: Negative for bladder incontinence and dysuria.  Musculoskeletal: Positive for back pain.  Neurological: Negative for weakness, numbness and paresthesias.  All other systems reviewed and are negative.     Allergies  Prednisone  Home Medications   Prior to Admission medications   Medication Sig Start Date End Date Taking? Authorizing Provider  beta carotene w/minerals (OCUVITE) tablet Take 1 tablet by mouth 2 (two) times daily.    Yes Historical Provider, MD    Cholecalciferol (VITAMIN D PO) Take 1 tablet by mouth daily.   Yes Historical Provider, MD  diphenhydramine-acetaminophen (TYLENOL PM) 25-500 MG TABS Take 2 tablets by mouth daily as needed (pain).   Yes Historical Provider, MD  ibuprofen (ADVIL,MOTRIN) 200 MG tablet Take 400 mg by mouth every 6 (six) hours as needed for headache or moderate pain.   Yes Historical Provider, MD  latanoprost (XALATAN) 0.005 % ophthalmic solution Place 1 drop into both eyes at bedtime.    Yes Historical Provider, MD  losartan-hydrochlorothiazide (HYZAAR) 50-12.5 MG per tablet Take 1 tablet by mouth daily. 10/24/14  Yes Nyoka Cowden, MD  sodium chloride (OCEAN) 0.65 % nasal spray Place 1 spray into the nose daily as needed for congestion.   Yes Historical Provider, MD  Vitamin D, Ergocalciferol, (DRISDOL) 50000 UNITS CAPS capsule Take 50,000 Units by mouth every 7 (seven) days. Tuesday   Yes Historical Provider, MD  albuterol (PROVENTIL HFA;VENTOLIN HFA) 108 (90 BASE) MCG/ACT inhaler Inhale 1-2 puffs into the lungs every 6 (six) hours as needed for wheezing. Patient not taking: Reported on 04/20/2015 06/30/13   Kaitlyn Szekalski, PA-C   BP 148/68 mmHg  Pulse 76  Temp(Src) 97.7 F (36.5 C) (Oral)  Resp 16  SpO2 96% Physical Exam  Constitutional: She is oriented to person, place, and time. She appears well-developed and well-nourished.  Non-toxic appearance. No distress.  HENT:  Head: Normocephalic and atraumatic.  Eyes: Conjunctivae, EOM and lids are normal. Pupils are equal, round, and reactive to light.  Neck: Normal range of motion.  Neck supple. No tracheal deviation present. No thyroid mass present.  Cardiovascular: Normal rate, regular rhythm and normal heart sounds.  Exam reveals no gallop.   No murmur heard. Pulmonary/Chest: Effort normal and breath sounds normal. No stridor. No respiratory distress. She has no decreased breath sounds. She has no wheezes. She has no rhonchi. She has no rales.  Abdominal:  Soft. Normal appearance and bowel sounds are normal. She exhibits no distension. There is no tenderness. There is no rebound and no CVA tenderness.  Musculoskeletal: Normal range of motion. She exhibits no edema or tenderness.       Back:  Neurological: She is alert and oriented to person, place, and time. She has normal strength. No cranial nerve deficit or sensory deficit. GCS eye subscore is 4. GCS verbal subscore is 5. GCS motor subscore is 6.  Skin: Skin is warm and dry. No abrasion and no rash noted.  Psychiatric: She has a normal mood and affect. Her speech is normal and behavior is normal.  Nursing note and vitals reviewed.   ED Course  Procedures (including critical care time) Labs Review Labs Reviewed  URINALYSIS, ROUTINE W REFLEX MICROSCOPIC (NOT AT [facility])    Imaging Review No results found. I, Toy Baker, personally reviewed and evaluated these images and lab results as part of my medical decision-making.   EKG Interpretation None      MDM   Final diagnoses:  None    Patient given pain meds and feels better. Likely musculoskeletal back pain. Stable for discharge    [name], MD 04/20/15 1334

## 2015-04-20 NOTE — Discharge Instructions (Signed)
Back Pain, Adult Low back pain is very common. About 1 in 5 people have back pain.The cause of low back pain is rarely dangerous. The pain often gets better over time.About half of people with a sudden onset of back pain feel better in just 2 weeks. About 8 in 10 people feel better by 6 weeks.  CAUSES Some common causes of back pain include:  Strain of the muscles or ligaments supporting the spine.  Wear and tear (degeneration) of the spinal discs.  Arthritis.  Direct injury to the back. DIAGNOSIS Most of the time, the direct cause of low back pain is not known.However, back pain can be treated effectively even when the exact cause of the pain is unknown.Answering your caregiver's questions about your overall health and symptoms is one of the most accurate ways to make sure the cause of your pain is not dangerous. If your caregiver needs more information, he or she may order lab work or imaging tests (X-rays or MRIs).However, even if imaging tests show changes in your back, this usually does not require surgery. HOME CARE INSTRUCTIONS For many people, back pain returns.Since low back pain is rarely dangerous, it is often a condition that people can learn to manageon their own.   Remain active. It is stressful on the back to sit or stand in one place. Do not sit, drive, or stand in one place for more than 30 minutes at a time. Take short walks on level surfaces as soon as pain allows.Try to increase the length of time you walk each day.  Do not stay in bed.Resting more than 1 or 2 days can delay your recovery.  Do not avoid exercise or work.Your body is made to move.It is not dangerous to be active, even though your back may hurt.Your back will likely heal faster if you return to being active before your pain is gone.  Pay attention to your body when you bend and lift. Many people have less discomfortwhen lifting if they bend their knees, keep the load close to their bodies,and  avoid twisting. Often, the most comfortable positions are those that put less stress on your recovering back.  Find a comfortable position to sleep. Use a firm mattress and lie on your side with your knees slightly bent. If you lie on your back, put a pillow under your knees.  Only take over-the-counter or prescription medicines as directed by your caregiver. Over-the-counter medicines to reduce pain and inflammation are often the most helpful.Your caregiver may prescribe muscle relaxant drugs.These medicines help dull your pain so you can more quickly return to your normal activities and healthy exercise.  Put ice on the injured area.  Put ice in a plastic bag.  Place a towel between your skin and the bag.  Leave the ice on for 15-20 minutes, 03-04 times a day for the first 2 to 3 days. After that, ice and heat may be alternated to reduce pain and spasms.  Ask your caregiver about trying back exercises and gentle massage. This may be of some benefit.  Avoid feeling anxious or stressed.Stress increases muscle tension and can worsen back pain.It is important to recognize when you are anxious or stressed and learn ways to manage it.Exercise is a great option. SEEK MEDICAL CARE IF:  You have pain that is not relieved with rest or medicine.  You have pain that does not improve in 1 week.  You have new symptoms.  You are generally not feeling well. SEEK   IMMEDIATE MEDICAL CARE IF:   You have pain that radiates from your back into your legs.  You develop new bowel or bladder control problems.  You have unusual weakness or numbness in your arms or legs.  You develop nausea or vomiting.  You develop abdominal pain.  You feel faint. Document Released: 08/25/2005 Document Revised: 02/24/2012 Document Reviewed: 12/27/2013 [employer] Patient Information 2015 [employer]. This information is not intended to replace advice given to you by your health care provider. Make sure you  discuss any questions you have with your health care provider.  

## 2015-04-20 NOTE — ED Notes (Addendum)
Pt states that about little over week ago she started having right lower back pain. Pt has been taking Tylenol with heat and ice to try to help relieve the pain over the past couple days.  Pt states that she picked up her great grandson and that is the only thing she could think of that could have caused that pain.  Pt had ruptured disc years ago.

## 2015-08-24 ENCOUNTER — Encounter (HOSPITAL_COMMUNITY): Payer: Self-pay | Admitting: Emergency Medicine

## 2015-08-24 ENCOUNTER — Emergency Department (HOSPITAL_COMMUNITY)
Admission: EM | Admit: 2015-08-24 | Discharge: 2015-08-24 | Disposition: A | Discharge status: Home / Self Care | Payer: Medicare Other | Attending: Emergency Medicine | Admitting: Emergency Medicine

## 2015-08-24 DIAGNOSIS — Z79899 Other long term (current) drug therapy: Secondary | ICD-10-CM | POA: Diagnosis not present

## 2015-08-24 DIAGNOSIS — Z87891 Personal history of nicotine dependence: Secondary | ICD-10-CM | POA: Diagnosis not present

## 2015-08-24 DIAGNOSIS — K529 Noninfective gastroenteritis and colitis, unspecified: Secondary | ICD-10-CM

## 2015-08-24 DIAGNOSIS — I10 Essential (primary) hypertension: Secondary | ICD-10-CM | POA: Diagnosis not present

## 2015-08-24 DIAGNOSIS — Z8669 Personal history of other diseases of the nervous system and sense organs: Secondary | ICD-10-CM | POA: Insufficient documentation

## 2015-08-24 DIAGNOSIS — Z8639 Personal history of other endocrine, nutritional and metabolic disease: Secondary | ICD-10-CM | POA: Diagnosis not present

## 2015-08-24 DIAGNOSIS — R197 Diarrhea, unspecified: Secondary | ICD-10-CM | POA: Diagnosis present

## 2015-08-24 LAB — CBC
HCT: 42.1 % (ref 36.0–46.0)
Hemoglobin: 13.9 g/dL (ref 12.0–15.0)
MCH: 28.3 pg (ref 26.0–34.0)
MCHC: 33 g/dL (ref 30.0–36.0)
MCV: 85.7 fL (ref 78.0–100.0)
PLATELETS: 376 10*3/uL (ref 150–400)
RBC: 4.91 MIL/uL (ref 3.87–5.11)
RDW: 13.4 % (ref 11.5–15.5)
WBC: 7.1 10*3/uL (ref 4.0–10.5)

## 2015-08-24 LAB — URINALYSIS, ROUTINE W REFLEX MICROSCOPIC
BILIRUBIN URINE: NEGATIVE
GLUCOSE, UA: NEGATIVE mg/dL
KETONES UR: NEGATIVE mg/dL
LEUKOCYTES UA: NEGATIVE
NITRITE: NEGATIVE
PH: 5.5 (ref 5.0–8.0)
Protein, ur: NEGATIVE mg/dL
Specific Gravity, Urine: 1.01 (ref 1.005–1.030)

## 2015-08-24 LAB — COMPREHENSIVE METABOLIC PANEL
ALK PHOS: 79 U/L (ref 38–126)
ALT: 13 U/L — AB (ref 14–54)
AST: 19 U/L (ref 15–41)
Albumin: 4 g/dL (ref 3.5–5.0)
Anion gap: 10 (ref 5–15)
BUN: 25 mg/dL — AB (ref 6–20)
CO2: 28 mmol/L (ref 22–32)
CREATININE: 0.79 mg/dL (ref 0.44–1.00)
Calcium: 8.9 mg/dL (ref 8.9–10.3)
Chloride: 97 mmol/L — ABNORMAL LOW (ref 101–111)
GFR calc Af Amer: >60 mL/min (ref >60)
Glucose, Bld: 111 mg/dL — ABNORMAL HIGH (ref 65–99)
Potassium: 3.8 mmol/L (ref 3.5–5.1)
Sodium: 135 mmol/L (ref 135–145)
Total Bilirubin: 0.5 mg/dL (ref 0.3–1.2)
Total Protein: 7.7 g/dL (ref 6.5–8.1)

## 2015-08-24 LAB — URINE MICROSCOPIC-ADD ON

## 2015-08-24 LAB — POC OCCULT BLOOD, ED: Fecal Occult Bld: NEGATIVE

## 2015-08-24 LAB — LIPASE, BLOOD: Lipase: 31 U/L (ref 11–51)

## 2015-08-24 MED ORDER — SODIUM CHLORIDE 0.9 % IV SOLN: INTRAVENOUS | Status: DC

## 2015-08-24 MED ORDER — SODIUM CHLORIDE 0.9 % IV BOLUS (SEPSIS)
1000.0000 mL | Freq: Once | INTRAVENOUS | Status: AC
  Administered 2015-08-24: 1000 mL via INTRAVENOUS

## 2015-08-24 NOTE — ED Provider Notes (Signed)
CSN: [id number]     Arrival date & time 08/24/15  [id number] History   First MD Initiated Contact with Patient 08/24/15 [phone]     Chief Complaint  Patient presents with  . Diarrhea     (Consider location/radiation/quality/duration/timing/severity/associated sxs/prior Treatment) HPI Comments: Patient here complaining of 3 days of dark stools since taking Pepto-Bismol. States that she had a recent viral illness that started several days ago that initially started with her having emesis and diarrhea but is now only diarrhea characterized as dark stools. Denies any abdominal pain. Denies any hematemesis. No fever or chills. No cough or congestion. Her diarrhea has been improving but she does have a history of GI bleed and is concerned that she might be having another one. No syncope or near-syncope. No treatment used for these symptoms prior to arrival  Patient is a 81 y.o. female presenting with diarrhea. The history is provided by the patient.  Diarrhea   Past Medical History  Diagnosis Date  . Hypertension   . Hyperlipidemia   . Macular degeneration    Past Surgical History  Procedure Laterality Date  . Appendectomy     No family history on file. Social History  Substance Use Topics  . Smoking status: Former Smoker -- 1.00 packs/day for 33 years    Types: Cigarettes  . Smokeless tobacco: Never Used  . Alcohol Use: No   OB History    No data available     Review of Systems  Gastrointestinal: Positive for diarrhea.  All other systems reviewed and are negative.     Allergies  Prednisone  Home Medications   Prior to Admission medications   Medication Sig Start Date End Date Taking? Authorizing Provider  albuterol (PROVENTIL HFA;VENTOLIN HFA) 108 (90 BASE) MCG/ACT inhaler Inhale 1-2 puffs into the lungs every 6 (six) hours as needed for wheezing. Patient not taking: Reported on 04/20/2015 06/30/13   Emilia BeckKaitlyn Szekalski, PA-C  beta carotene w/minerals (OCUVITE) tablet Take 1  tablet by mouth 2 (two) times daily.     Historical Provider, MD  Cholecalciferol (VITAMIN D PO) Take 1 tablet by mouth daily.    Historical Provider, MD  diazepam (VALIUM) 2 MG tablet Take 1 tablet (2 mg total) by mouth every 6 (six) hours as needed for muscle spasms. 04/20/15   Lorre NickAnthony Orla Estrin, MD  diphenhydramine-acetaminophen (TYLENOL PM) 25-500 MG TABS Take 2 tablets by mouth daily as needed (pain).    Historical Provider, MD  ibuprofen (ADVIL,MOTRIN) 200 MG tablet Take 400 mg by mouth every 6 (six) hours as needed for headache or moderate pain.    Historical Provider, MD  latanoprost (XALATAN) 0.005 % ophthalmic solution Place 1 drop into both eyes at bedtime.     Historical Provider, MD  losartan-hydrochlorothiazide (HYZAAR) 50-12.5 MG per tablet Take 1 tablet by mouth daily. 10/24/14   Nyoka CowdenMichael B Wert, MD  oxyCODONE-acetaminophen (PERCOCET/ROXICET) 5-325 MG per tablet Take 2 tablets by mouth every 4 (four) hours as needed for severe pain. 04/20/15   Lorre NickAnthony Zyiere Rosemond, MD  sodium chloride (OCEAN) 0.65 % nasal spray Place 1 spray into the nose daily as needed for congestion.    Historical Provider, MD  Vitamin D, Ergocalciferol, (DRISDOL) 50000 UNITS CAPS capsule Take 50,000 Units by mouth every 7 (seven) days. Tuesday    Historical Provider, MD   BP 123/101 mmHg  Pulse 74  Temp(Src) 97.9 F (36.6 C) (Oral)  Resp 18  SpO2 100% Physical Exam  Constitutional: She is oriented to person, place, and  time. She appears well-developed and well-nourished.  Non-toxic appearance. No distress.  HENT:  Head: Normocephalic and atraumatic.  Eyes: Conjunctivae, EOM and lids are normal. Pupils are equal, round, and reactive to light.  Neck: Normal range of motion. Neck supple. No tracheal deviation present. No thyroid mass present.  Cardiovascular: Normal rate, regular rhythm and normal heart sounds.  Exam reveals no gallop.   No murmur heard. Pulmonary/Chest: Effort normal and breath sounds normal. No stridor.  No respiratory distress. She has no decreased breath sounds. She has no wheezes. She has no rhonchi. She has no rales.  Abdominal: Soft. Normal appearance and bowel sounds are normal. She exhibits no distension. There is no tenderness. There is no rebound and no CVA tenderness.  Musculoskeletal: Normal range of motion. She exhibits no edema or tenderness.  Neurological: She is alert and oriented to person, place, and time. She has normal strength. No cranial nerve deficit or sensory deficit. GCS eye subscore is 4. GCS verbal subscore is 5. GCS motor subscore is 6.  Skin: Skin is warm and dry. No abrasion and no rash noted.  Psychiatric: She has a normal mood and affect. Her speech is normal and behavior is normal.  Nursing note and vitals reviewed.   ED Course  Procedures (including critical care time) Labs Review Labs Reviewed  LIPASE, BLOOD  COMPREHENSIVE METABOLIC PANEL  CBC  URINALYSIS, ROUTINE W REFLEX MICROSCOPIC (NOT AT [facility])  POC OCCULT BLOOD, ED    Imaging Review No results found. I have personally reviewed and evaluated these images and lab results as part of my medical decision-making.   EKG Interpretation None      MDM   Final diagnoses:  None    Patient without signs of dehydration here. Her diarrhea is resolving. She was guaiac negative. Do not think this represents GI bleed and likely a viral illness. She is tracking appropriately at this time is stable for discharge    [name], MD 08/24/15 [phone]

## 2015-08-24 NOTE — ED Notes (Signed)
Per pt/daughter-states abdominal pain and diarrhea since Tuesday-history of GI bleed

## 2015-08-24 NOTE — ED Notes (Signed)
MD at bedside. EDP ALLEN PRESENT 

## 2015-08-24 NOTE — ED Notes (Signed)
Pt ambulated to RR unassisted 

## 2015-08-24 NOTE — Discharge Instructions (Signed)

## 2015-11-04 ENCOUNTER — Encounter (HOSPITAL_COMMUNITY): Payer: Self-pay | Admitting: Emergency Medicine

## 2015-11-04 ENCOUNTER — Emergency Department (HOSPITAL_COMMUNITY)
Admission: EM | Admit: 2015-11-04 | Discharge: 2015-11-05 | Disposition: A | Discharge status: Home / Self Care | Payer: Medicare Other | Attending: Emergency Medicine | Admitting: Emergency Medicine

## 2015-11-04 DIAGNOSIS — Z792 Long term (current) use of antibiotics: Secondary | ICD-10-CM | POA: Diagnosis not present

## 2015-11-04 DIAGNOSIS — I10 Essential (primary) hypertension: Secondary | ICD-10-CM | POA: Diagnosis not present

## 2015-11-04 DIAGNOSIS — Z79899 Other long term (current) drug therapy: Secondary | ICD-10-CM | POA: Diagnosis not present

## 2015-11-04 DIAGNOSIS — R1084 Generalized abdominal pain: Secondary | ICD-10-CM | POA: Diagnosis not present

## 2015-11-04 DIAGNOSIS — Z87891 Personal history of nicotine dependence: Secondary | ICD-10-CM | POA: Diagnosis not present

## 2015-11-04 DIAGNOSIS — Z8639 Personal history of other endocrine, nutritional and metabolic disease: Secondary | ICD-10-CM | POA: Diagnosis not present

## 2015-11-04 DIAGNOSIS — Z8669 Personal history of other diseases of the nervous system and sense organs: Secondary | ICD-10-CM | POA: Diagnosis not present

## 2015-11-04 LAB — CBC
HCT: 40.1 % (ref 36.0–46.0)
HEMOGLOBIN: 13.1 g/dL (ref 12.0–15.0)
MCH: 28.6 pg (ref 26.0–34.0)
MCHC: 32.7 g/dL (ref 30.0–36.0)
MCV: 87.6 fL (ref 78.0–100.0)
PLATELETS: 351 10*3/uL (ref 150–400)
RBC: 4.58 MIL/uL (ref 3.87–5.11)
RDW: 13.6 % (ref 11.5–15.5)
WBC: 11 10*3/uL — AB (ref 4.0–10.5)

## 2015-11-04 NOTE — ED Notes (Addendum)
Patient here from home with complaints of abd pain x2 days. Given antibiotics by primary care dr. Lisabeth Devoid medication is making it worse. Thinks she may be having a "reaction" to the medication. Reports taking Amoxicillin and Biaxin.

## 2015-11-04 NOTE — ED Provider Notes (Signed)
CSN: [id number]     Arrival date & time 11/04/15  2141 History  By signing my name below, I, Dana Blake, attest that this documentation has been prepared under the direction and in the presence of No att. providers found.   Electronically Signed: Iona Blake, ED Scribe. 11/05/2015. 5:35 AM    Chief Complaint  Patient presents with  . Abdominal Pain     The history is provided by the patient. No language interpreter was used.   HPI Comments: Dana Blake is a 82 y.o. female who presents to the Emergency Department complaining of gradual onset, intermittent, throbbing lower abdominal pain, onset about three days ago. She says that she has been having generalized abdomen pain and gas-like symptoms, but the pain in the lower abdomen is more severe. Pt states that the abdominal pain lasts around two to three hours at a time. Pt also complains of associated intermittent dizziness, and states that she a particularly intense episode a few days ago at the dentist which lasted around five hours.  Walking and movement help alleviate the abdominal pain. Pt states that eating occasionally worsens the abdominal pain. Pt denies hematochezia, dysuria, hematuria, loss of appetite, or any other pertinent symptoms.   Past Medical History  Diagnosis Date  . Hypertension   . Hyperlipidemia   . Macular degeneration    Past Surgical History  Procedure Laterality Date  . Appendectomy     No family history on file. Social History  Substance Use Topics  . Smoking status: Former Smoker -- 1.00 packs/day for 33 years    Types: Cigarettes  . Smokeless tobacco: Never Used  . Alcohol Use: No   OB History    No data available     Review of Systems  10 Systems reviewed and all are negative for acute change except as noted in the HPI.   Allergies  Neo-synephrine and Prednisone  Home Medications   Prior to Admission medications   Medication Sig Start Date End Date Taking? Authorizing  Provider  amoxicillin (AMOXIL) 500 MG capsule Take 500 mg by mouth 2 (two) times daily. 10/25/15  Yes Historical Provider, MD  beta carotene w/minerals (OCUVITE) tablet Take 1 tablet by mouth 2 (two) times daily.    Yes Historical Provider, MD  clarithromycin (BIAXIN) 500 MG tablet Take 1,000 mg by mouth 2 (two) times daily. 14 day therapy course patient began on 10/25/15. (patient had stopped and then resumed per MD) 10/25/15  Yes Historical Provider, MD  lansoprazole (PREVACID) 15 MG capsule Take 30 mg by mouth 2 (two) times daily.   Yes Historical Provider, MD  latanoprost (XALATAN) 0.005 % ophthalmic solution Place 1 drop into both eyes at bedtime.    Yes Historical Provider, MD  losartan-hydrochlorothiazide (HYZAAR) 50-12.5 MG per tablet Take 1 tablet by mouth daily. 10/24/14  Yes Nyoka Cowden, MD  sodium chloride (OCEAN) 0.65 % SOLN nasal spray Place 1 spray into both nostrils as needed for congestion.   Yes Historical Provider, MD  timolol (TIMOPTIC) 0.5 % ophthalmic solution Place 1 drop into both eyes every morning. 07/26/15  Yes Historical Provider, MD  Vitamin D, Ergocalciferol, (DRISDOL) 50000 units CAPS capsule Take 50,000 Units by mouth once a week. 10/24/15  Yes Historical Provider, MD  diazepam (VALIUM) 2 MG tablet Take 1 tablet (2 mg total) by mouth every 6 (six) hours as needed for muscle spasms. Patient not taking: Reported on 11/04/2015 04/20/15   [name], MD  HYDROcodone-acetaminophen (NORCO/VICODIN) 5-325 MG  tablet Take 1 tablet by mouth every 6 (six) hours as needed. 11/05/15   Derwood Kaplan, MD  oxyCODONE-acetaminophen (PERCOCET/ROXICET) 5-325 MG per tablet Take 2 tablets by mouth every 4 (four) hours as needed for severe pain. Patient not taking: Reported on 11/04/2015 04/20/15   [name], MD   BP 167/89 mmHg  Pulse 88  Temp(Src) 97.5 F (36.4 C) (Oral)  Resp 20  SpO2 95% Physical Exam  Constitutional: She is oriented to person, place, and time. She appears  well-developed and well-nourished. No distress.  HENT:  Head: Normocephalic and atraumatic.  Eyes: EOM are normal.  Neck: Normal range of motion.  Cardiovascular: Normal rate, regular rhythm and normal heart sounds.   No murmur heard. Pulmonary/Chest: Effort normal and breath sounds normal. No respiratory distress. She has no wheezes. She has no rales.  Abdominal: Soft. Bowel sounds are normal. She exhibits no distension. There is tenderness. There is guarding. There is no rebound.  Generalized TTP to abdomen.  Voluntary guarding.  No palpable masses on abdomen.   Musculoskeletal: Normal range of motion.  Neurological: She is alert and oriented to person, place, and time.  Skin: Skin is warm and dry.  Psychiatric: She has a normal mood and affect. Judgment normal.  Nursing note and vitals reviewed.   ED Course  Procedures (including critical care time) DIAGNOSTIC STUDIES: Oxygen Saturation is 95% on RA, adeuqate by my interpretation.    COORDINATION OF CARE: 12:07 AM-Discussed treatment plan which includes CT abdomen with pelvis contrast with pt at bedside and pt agreed to plan.    Labs Review Labs Reviewed  COMPREHENSIVE METABOLIC PANEL - Abnormal; Notable for the following:    Glucose, Bld 109 (*)    All other components within normal limits  CBC - Abnormal; Notable for the following:    WBC 11.0 (*)    All other components within normal limits  URINALYSIS, ROUTINE W REFLEX MICROSCOPIC (NOT AT [facility]) - Abnormal; Notable for the following:    Leukocytes, UA TRACE (*)    All other components within normal limits  DIFFERENTIAL - Abnormal; Notable for the following:    Monocytes Absolute 1.2 (*)    All other components within normal limits  URINE MICROSCOPIC-ADD ON - Abnormal; Notable for the following:    Squamous Epithelial / LPF 0-5 (*)    Bacteria, UA RARE (*)    All other components within normal limits  LIPASE, BLOOD    Imaging Review Ct Abdomen Pelvis W  Contrast  11/05/2015  CLINICAL DATA:  Acute onset of generalized chest pain. Initial encounter. EXAM: CT ABDOMEN AND PELVIS WITH CONTRAST TECHNIQUE: Multidetector CT imaging of the abdomen and pelvis was performed using the standard protocol following bolus administration of intravenous contrast. CONTRAST:  OMNIPAQUE IOHEXOL 300 MG/ML  SOLN COMPARISON:  Lumbar spine radiographs performed 08/02/2010 FINDINGS: The visualized lung bases are clear. Scattered hypodensities are noted within the liver, measuring up to 1.3 cm in size. These are nonspecific. The spleen is unremarkable in appearance. The gallbladder is within normal limits. The pancreas and adrenal glands are unremarkable. A 1.5 cm cyst is noted at the lower pole of the right kidney. There is no evidence of hydronephrosis. No renal or ureteral stones are seen. No perinephric stranding is appreciated. No free fluid is identified. The small bowel is unremarkable in appearance. The stomach is within normal limits. No acute vascular abnormalities are seen. Scattered calcification is noted along the abdominal aorta and its branches. The patient is  status post appendectomy. The colon is unremarkable in appearance. The bladder is moderately distended and grossly unremarkable. The uterus is unremarkable in appearance. The ovaries are relatively symmetric. No suspicious adnexal masses are seen. No inguinal lymphadenopathy is seen. No acute osseous abnormalities are identified. Facet disease is noted along the lower lumbar spine. IMPRESSION: 1. No acute abnormality seen within the abdomen or pelvis. 2. Scattered nonspecific hypodensities within the liver, measuring up to 1.3 cm in size. 3. Small right renal cyst noted. 4. Scattered calcification along the abdominal aorta and its branches. Electronically Signed   By: Roanna Raider M.D.   On: 11/05/2015 01:30   I have personally reviewed and evaluated these images and lab results as part of my medical  decision-making.   EKG Interpretation None       Reassessment Note: Repeat abdominal exam performed with no change to results. Results of labs and images discussed with pt. Pt is comfortable with findings and will see PCP on Friday, 11/05/2015.  MDM   Final diagnoses:  Generalized abdominal discomfort   I personally performed the services described in this documentation, which was scribed in my presence. The recorded information has been reviewed and is accurate.  PT comes in with cc of abd pain. Pt is relatively healthy. She has normal vitals. Intermittent abd pain, severe when she has it at times, with generalized tenderness. Also dizziness - orthostatic type, but lasting a bit longer.  Dizziness - not on betablocker, CCB. Will likely need outpatient duplex of the varotid and vertebrals.  Abd pain - diffuse tenderness. We will have to do a CT. Will r/o AAA as well.      Derwood Kaplan, MD 11/05/15 [phone]

## 2015-11-05 ENCOUNTER — Emergency Department (HOSPITAL_COMMUNITY): Payer: Medicare Other

## 2015-11-05 LAB — COMPREHENSIVE METABOLIC PANEL
ALBUMIN: 4.2 g/dL (ref 3.5–5.0)
ALK PHOS: 84 U/L (ref 38–126)
ALT: 17 U/L (ref 14–54)
ANION GAP: 11 (ref 5–15)
AST: 25 U/L (ref 15–41)
BUN: 13 mg/dL (ref 6–20)
CALCIUM: 9.4 mg/dL (ref 8.9–10.3)
CO2: 28 mmol/L (ref 22–32)
Chloride: 103 mmol/L (ref 101–111)
Creatinine, Ser: 0.66 mg/dL (ref 0.44–1.00)
GFR calc Af Amer: >60 mL/min (ref >60)
GFR calc non Af Amer: >60 mL/min (ref >60)
GLUCOSE: 109 mg/dL — AB (ref 65–99)
POTASSIUM: 3.5 mmol/L (ref 3.5–5.1)
SODIUM: 142 mmol/L (ref 135–145)
Total Bilirubin: 0.6 mg/dL (ref 0.3–1.2)
Total Protein: 8.1 g/dL (ref 6.5–8.1)

## 2015-11-05 LAB — DIFFERENTIAL
BASOS ABS: 0 10*3/uL (ref 0.0–0.1)
BASOS PCT: 0 %
EOS ABS: 0.2 10*3/uL (ref 0.0–0.7)
Eosinophils Relative: 2 %
Lymphocytes Relative: 26 %
Lymphs Abs: 2.8 10*3/uL (ref 0.7–4.0)
MONOS PCT: 11 %
Monocytes Absolute: 1.2 10*3/uL — ABNORMAL HIGH (ref 0.1–1.0)
Neutro Abs: 6.6 10*3/uL (ref 1.7–7.7)
Neutrophils Relative %: 61 %

## 2015-11-05 LAB — URINALYSIS, ROUTINE W REFLEX MICROSCOPIC
BILIRUBIN URINE: NEGATIVE
Glucose, UA: NEGATIVE mg/dL
Hgb urine dipstick: NEGATIVE
KETONES UR: NEGATIVE mg/dL
NITRITE: NEGATIVE
PH: 7 (ref 5.0–8.0)
Protein, ur: NEGATIVE mg/dL
Specific Gravity, Urine: 1.017 (ref 1.005–1.030)

## 2015-11-05 LAB — URINE MICROSCOPIC-ADD ON: RBC / HPF: NONE SEEN RBC/hpf (ref 0–5)

## 2015-11-05 LAB — LIPASE, BLOOD: Lipase: 46 U/L (ref 11–51)

## 2015-11-05 MED ORDER — HYDROCODONE-ACETAMINOPHEN 5-325 MG PO TABS: 1.0000 | ORAL_TABLET | Freq: Four times a day (QID) | ORAL | Status: DC | PRN

## 2015-11-05 MED ORDER — SODIUM CHLORIDE 0.9 % IV BOLUS (SEPSIS)
1000.0000 mL | Freq: Once | INTRAVENOUS | Status: AC
  Administered 2015-11-05: 1000 mL via INTRAVENOUS

## 2015-11-05 MED ORDER — IOHEXOL 300 MG/ML  SOLN
50.0000 mL | Freq: Once | INTRAMUSCULAR | Status: AC | PRN
  Administered 2015-11-05: 50 mL via ORAL

## 2015-11-05 MED ORDER — IOHEXOL 300 MG/ML  SOLN
100.0000 mL | Freq: Once | INTRAMUSCULAR | Status: AC | PRN
  Administered 2015-11-05: 100 mL via INTRAVENOUS

## 2015-11-05 MED ORDER — HYDROCODONE-ACETAMINOPHEN 5-325 MG PO TABS
1.0000 | ORAL_TABLET | Freq: Once | ORAL | Status: AC
  Administered 2015-11-05: 1 via ORAL
  Filled 2015-11-05: qty 1

## 2015-11-05 NOTE — ED Notes (Signed)
Patient transported to CT 

## 2015-11-05 NOTE — Discharge Instructions (Signed)
Take the pain meds as needed. Try to finish the antibiotic course.  See your primary doctor on Friday - see if they can think of other source, or if you need to see a specialist.   Abdominal Pain, Adult Many things can cause abdominal pain. Usually, abdominal pain is not caused by a disease and will improve without treatment. It can often be observed and treated at home. Your health care provider will do a physical exam and possibly order blood tests and X-rays to help determine the seriousness of your pain. However, in many cases, more time must pass before a clear cause of the pain can be found. Before that point, your health care provider may not know if you need more testing or further treatment. HOME CARE INSTRUCTIONS Monitor your abdominal pain for any changes. The following actions may help to alleviate any discomfort you are experiencing:  Only take over-the-counter or prescription medicines as directed by your health care provider.  Do not take laxatives unless directed to do so by your health care provider.  Try a clear liquid diet (broth, tea, or water) as directed by your health care provider. Slowly move to a bland diet as tolerated. SEEK MEDICAL CARE IF:  You have unexplained abdominal pain.  You have abdominal pain associated with nausea or diarrhea.  You have pain when you urinate or have a bowel movement.  You experience abdominal pain that wakes you in the night.  You have abdominal pain that is worsened or improved by eating food.  You have abdominal pain that is worsened with eating fatty foods.  You have a fever. SEEK IMMEDIATE MEDICAL CARE IF:  Your pain does not go away within 2 hours.  You keep throwing up (vomiting).  Your pain is felt only in portions of the abdomen, such as the right side or the left lower portion of the abdomen.  You pass bloody or black tarry stools. MAKE SURE YOU:  Understand these instructions.  Will watch your  condition.  Will get help right away if you are not doing well or get worse.   This information is not intended to replace advice given to you by your health care provider. Make sure you discuss any questions you have with your health care provider.   Document Released: 06/04/2005 Document Revised: 05/16/2015 Document Reviewed: 05/04/2013 Elsevier Interactive Patient Education 2016 [employer] Helicobacter Pylori Antibodies Test WHY AM I HAVING THIS TEST? This is a blood test that looks for bacteria called Helicobacter pylori (H. pylori). H. pylori is a germ that can be found in the cells that line the stomach. Having high levels of H. pylori in your stomach puts you at risk for stomach ulcers and small bowel ulcers, long-term (chronic) inflammation of the lining of the stomach, or even ulcers that may occur in the canal that runs from the mouth to the stomach (esophagus). Presence of H. pylori can also increase your risk for stomach cancer if it is left untreated. Most people with H. pylori in their stomach bacteria have no symptoms. Your health care provider may ask you to have this test if you have symptoms of a stomach ulcer or small bowel ulcer, such as stomach pain before or after eating, heartburn, or nausea repeatedly after eating. WHAT KIND OF SAMPLE IS TAKEN? A blood sample is required for this test. It is usually collected by inserting a needle into a vein or sticking a finger with a small needle. HOW DO I PREPARE FOR  THE TEST? There is no preparation required for this test. WHAT ARE THE REFERENCE RANGES? Reference ranges are considered healthy ranges established after testing a large group of healthy people. Reference ranges may vary among different people, labs, and hospitals. It is your responsibility to obtain your test results. Ask the lab or department performing the test when and how you will get your results. Your test results will be reported as positive, negative, or  equivocal. Equivocal means that your results are neither positive nor negative. References ranges for these results are as follows:  Less than or equal to 30 units/mL. This is negative.  Greater than or equal to 40 units/mL. This is positive.  30.01-39.99 units/mL. This is equivocal. WHAT DO THE RESULTS MEAN? Test results that are higher than normal may indicate numerous health conditions. These may include:  Short-term or long-term irritation of the stomach lining (gastritis).  Small bowel ulcer.  Stomach ulcer.  Stomach cancer. Talk with your health care provider to discuss your results, treatment options, and if necessary, the need for more tests. Talk with your health care provider if you have any questions about your results.   This information is not intended to replace advice given to you by your health care provider. Make sure you discuss any questions you have with your health care provider.   Document Released: 09/18/2004 Document Revised: 09/15/2014 Document Reviewed: 01/06/2014 Elsevier Interactive Patient Education 2016 [employer]. Food Choices for Peptic Ulcer Disease When you have peptic ulcer disease, the foods you eat and your eating habits are very important. Choosing the right foods can help ease the discomfort of peptic ulcer disease. WHAT GENERAL GUIDELINES DO I NEED TO FOLLOW?  Choose fruits, vegetables, whole grains, and low-fat meat, fish, and poultry.   Keep a food diary to identify foods that cause symptoms.  Avoid foods that cause irritation or pain. These may be different for different people.  Eat frequent small meals instead of three large meals each day. The pain may be worse when your stomach is empty.  Avoid eating close to bedtime. WHAT FOODS ARE NOT RECOMMENDED? The following are some foods and drinks that may worsen your symptoms:  Black, white, and red pepper.  Hot sauce.  Chili peppers.  Chili powder.  Chocolate and cocoa.    Alcohol.  Tea, coffee, and cola (regular and decaffeinated). The items listed above may not be a complete list of foods and beverages to avoid. Contact your dietitian for more information.   This information is not intended to replace advice given to you by your health care provider. Make sure you discuss any questions you have with your health care provider.   Document Released: 11/17/2011 Document Revised: 08/30/2013 Document Reviewed: 06/29/2013 Elsevier Interactive Patient Education 2016 Elsevier Inc. Peptic Ulcer A peptic ulcer is a sore in the lining of your esophagus (esophageal ulcer), stomach (gastric ulcer), or in the first part of your small intestine (duodenal ulcer). The ulcer causes erosion into the deeper tissue. CAUSES  Normally, the lining of the stomach and the small intestine protects itself from the acid that digests food. The protective lining can be damaged by:  An infection caused by a bacterium called Helicobacter pylori (H. pylori).  Regular use of nonsteroidal anti-inflammatory drugs (NSAIDs), such as ibuprofen or aspirin.  Smoking tobacco. Other risk factors include being older than 50, drinking alcohol excessively, and having a family history of ulcer disease.  SYMPTOMS   Burning pain or gnawing in the area between  the chest and the belly button.  Heartburn.  Nausea and vomiting.  Bloating. The pain can be worse on an empty stomach and at night. If the ulcer results in bleeding, it can cause:  Black, tarry stools.  Vomiting of bright red blood.  Vomiting of coffee-ground-looking materials. DIAGNOSIS  A diagnosis is usually made based upon your history and an exam. Other tests and procedures may be performed to find the cause of the ulcer. Finding a cause will help determine the best treatment. Tests and procedures may include:  Blood tests, stool tests, or breath tests to check for the bacterium H. pylori.  An upper gastrointestinal (GI)  series of the esophagus, stomach, and small intestine.  An endoscopy to examine the esophagus, stomach, and small intestine.  A biopsy. TREATMENT  Treatment may include:  Eliminating the cause of the ulcer, such as smoking, NSAIDs, or alcohol.  Medicines to reduce the amount of acid in your digestive tract.  Antibiotic medicines if the ulcer is caused by the H. pylori bacterium.  An upper endoscopy to treat a bleeding ulcer.  Surgery if the bleeding is severe or if the ulcer created a hole somewhere in the digestive system. HOME CARE INSTRUCTIONS   Avoid tobacco, alcohol, and caffeine. Smoking can increase the acid in the stomach, and continued smoking will impair the healing of ulcers.  Avoid foods and drinks that seem to cause discomfort or aggravate your ulcer.  Only take medicines as directed by your caregiver. Do not substitute over-the-counter medicines for prescription medicines without talking to your caregiver.  Keep any follow-up appointments and tests as directed. SEEK MEDICAL CARE IF:   Your do not improve within 7 days of starting treatment.  You have ongoing indigestion or heartburn. SEEK IMMEDIATE MEDICAL CARE IF:   You have sudden, sharp, or persistent abdominal pain.  You have bloody or dark black, tarry stools.  You vomit blood or vomit that looks like coffee grounds.  You become light-headed, weak, or feel faint.  You become sweaty or clammy. MAKE SURE YOU:   Understand these instructions.  Will watch your condition.  Will get help right away if you are not doing well or get worse.   This information is not intended to replace advice given to you by your health care provider. Make sure you discuss any questions you have with your health care provider.   Document Released: 08/22/2000 Document Revised: 09/15/2014 Document Reviewed: 03/24/2012 Elsevier Interactive Patient Education [employer]

## 2015-11-05 NOTE — ED Notes (Signed)
MD at bedside. 

## 2015-12-04 ENCOUNTER — Other Ambulatory Visit: Payer: Self-pay

## 2015-12-04 DIAGNOSIS — Z1231 Encounter for screening mammogram for malignant neoplasm of breast: Secondary | ICD-10-CM

## 2015-12-13 ENCOUNTER — Encounter: Payer: Self-pay | Admitting: Internal Medicine

## 2015-12-14 ENCOUNTER — Ambulatory Visit: Payer: Medicare Other

## 2016-01-31 ENCOUNTER — Ambulatory Visit (INDEPENDENT_AMBULATORY_CARE_PROVIDER_SITE_OTHER): Payer: Medicare Other | Admitting: Ophthalmology

## 2016-01-31 DIAGNOSIS — I10 Essential (primary) hypertension: Secondary | ICD-10-CM | POA: Diagnosis not present

## 2016-01-31 DIAGNOSIS — H35033 Hypertensive retinopathy, bilateral: Secondary | ICD-10-CM

## 2016-01-31 DIAGNOSIS — H353134 Nonexudative age-related macular degeneration, bilateral, advanced atrophic with subfoveal involvement: Secondary | ICD-10-CM | POA: Diagnosis not present

## 2016-01-31 DIAGNOSIS — H43813 Vitreous degeneration, bilateral: Secondary | ICD-10-CM | POA: Diagnosis not present

## 2016-02-05 ENCOUNTER — Ambulatory Visit
Admission: RE | Admit: 2016-02-05 | Discharge: 2016-02-05 | Disposition: A | Discharge status: Home / Self Care | Payer: Medicare Other | Source: Ambulatory Visit

## 2016-02-05 DIAGNOSIS — Z1231 Encounter for screening mammogram for malignant neoplasm of breast: Secondary | ICD-10-CM

## 2016-02-07 ENCOUNTER — Ambulatory Visit: Payer: Medicare Other | Admitting: Internal Medicine

## 2016-09-08 ENCOUNTER — Emergency Department (HOSPITAL_COMMUNITY): Payer: Medicare Other

## 2016-09-08 ENCOUNTER — Encounter (HOSPITAL_COMMUNITY): Payer: Self-pay | Admitting: Family Medicine

## 2016-09-08 ENCOUNTER — Emergency Department (HOSPITAL_COMMUNITY)
Admission: EM | Admit: 2016-09-08 | Discharge: 2016-09-08 | Disposition: A | Discharge status: Home / Self Care | Payer: Medicare Other | Attending: Emergency Medicine | Admitting: Emergency Medicine

## 2016-09-08 DIAGNOSIS — Z79899 Other long term (current) drug therapy: Secondary | ICD-10-CM | POA: Diagnosis not present

## 2016-09-08 DIAGNOSIS — I10 Essential (primary) hypertension: Secondary | ICD-10-CM | POA: Insufficient documentation

## 2016-09-08 DIAGNOSIS — Z87891 Personal history of nicotine dependence: Secondary | ICD-10-CM | POA: Insufficient documentation

## 2016-09-08 DIAGNOSIS — J189 Pneumonia, unspecified organism: Secondary | ICD-10-CM | POA: Insufficient documentation

## 2016-09-08 DIAGNOSIS — R05 Cough: Secondary | ICD-10-CM | POA: Diagnosis present

## 2016-09-08 DIAGNOSIS — J449 Chronic obstructive pulmonary disease, unspecified: Secondary | ICD-10-CM | POA: Insufficient documentation

## 2016-09-08 HISTORY — DX: Personal history of peptic ulcer disease: Z87.11

## 2016-09-08 LAB — BASIC METABOLIC PANEL
ANION GAP: 8 (ref 5–15)
BUN: 11 mg/dL (ref 6–20)
CO2: 27 mmol/L (ref 22–32)
Calcium: 8.4 mg/dL — ABNORMAL LOW (ref 8.9–10.3)
Chloride: 102 mmol/L (ref 101–111)
Creatinine, Ser: 0.63 mg/dL (ref 0.44–1.00)
GFR calc Af Amer: >60 mL/min (ref >60)
Glucose, Bld: 103 mg/dL — ABNORMAL HIGH (ref 65–99)
POTASSIUM: 3.5 mmol/L (ref 3.5–5.1)
SODIUM: 137 mmol/L (ref 135–145)

## 2016-09-08 LAB — I-STAT TROPONIN, ED: Troponin i, poc: 0 ng/mL (ref 0.00–0.08)

## 2016-09-08 LAB — CBC
HEMATOCRIT: 35 % — AB (ref 36.0–46.0)
HEMOGLOBIN: 11.6 g/dL — AB (ref 12.0–15.0)
MCH: 28 pg (ref 26.0–34.0)
MCHC: 33.1 g/dL (ref 30.0–36.0)
MCV: 84.5 fL (ref 78.0–100.0)
Platelets: 329 10*3/uL (ref 150–400)
RBC: 4.14 MIL/uL (ref 3.87–5.11)
RDW: 13.7 % (ref 11.5–15.5)
WBC: 13.3 10*3/uL — AB (ref 4.0–10.5)

## 2016-09-08 MED ORDER — AMOXICILLIN-POT CLAVULANATE 875-125 MG PO TABS: 1.0000 | ORAL_TABLET | Freq: Two times a day (BID) | ORAL | 0 refills | Status: DC

## 2016-09-08 MED ORDER — AMOXICILLIN-POT CLAVULANATE 875-125 MG PO TABS
1.0000 | ORAL_TABLET | Freq: Once | ORAL | Status: AC
  Administered 2016-09-08: 1 via ORAL
  Filled 2016-09-08: qty 1

## 2016-09-08 MED ORDER — IOPAMIDOL (ISOVUE-300) INJECTION 61%
INTRAVENOUS | Status: AC
  Administered 2016-09-08: 100 mL
  Filled 2016-09-08: qty 75

## 2016-09-08 MED ORDER — ALBUTEROL SULFATE HFA 108 (90 BASE) MCG/ACT IN AERS
2.0000 | INHALATION_SPRAY | Freq: Once | RESPIRATORY_TRACT | Status: AC
  Administered 2016-09-08: 2 via RESPIRATORY_TRACT
  Filled 2016-09-08: qty 6.7

## 2016-09-08 MED ORDER — DEXAMETHASONE 4 MG PO TABS
10.0000 mg | ORAL_TABLET | Freq: Once | ORAL | Status: AC
  Administered 2016-09-08: 19:00:00 10 mg via ORAL
  Filled 2016-09-08: qty 2

## 2016-09-08 NOTE — ED Triage Notes (Signed)
Patient started experiencing shortness of breath last Wednesday. On Saturday, she went to the walk-in-clinic. Given a Z-pack for symptoms. Pt reports after taking first dose of medication, she developed heart palpitations. Today, she developed chest tightness. Pt reports she feels like she should be improving more than what she feels now. She reports feeling weak, sore throat.

## 2016-09-09 NOTE — ED Provider Notes (Signed)
[facility]-EMERGENCY DEPT Provider Note   CSN: [id number] Arrival date & time: 09/08/16  1652     History   Chief Complaint Chief Complaint  Patient presents with  . Chest Pain  . Cough    HPI Dana Blake is a 83 y.o. female.   Chest Pain   Associated symptoms include cough.  Cough  This is a new problem. The current episode started more than 2 days ago. The problem occurs constantly. The problem has not changed since onset.The cough is productive of sputum. The maximum temperature recorded prior to her arrival was 100 to 100.9 F. Associated symptoms include chest pain.    Past Medical History:  Diagnosis Date  . History of bleeding ulcers   . Hyperlipidemia   . Hypertension   . Macular degeneration     Patient Active Problem List   Diagnosis Date Noted  . COPD  GOLD I 02/24/2014  . Multiple pulmonary nodules c/w MAI clinically  07/29/2013  . Cough 07/03/2013  . PEPTIC ULCER DISEASE 09/08/2007  . HYPERTENSION NEC 09/08/2007    Past Surgical History:  Procedure Laterality Date  . APPENDECTOMY    . BACK SURGERY    . FINGER SURGERY     Left Little Finger    OB History    No data available       Home Medications    Prior to Admission medications   Medication Sig Start Date End Date Taking? Authorizing Provider  azithromycin (ZITHROMAX) 250 MG tablet Take 250-500 mg by mouth as directed. Take 500mg  on day 1 and then 250mg  for 4 more days 09/06/16  Yes Historical Provider, MD  beta carotene w/minerals (OCUVITE) tablet Take 1 tablet by mouth 2 (two) times daily.    Yes Historical Provider, MD  HYDROcodone-acetaminophen (NORCO/VICODIN) 5-325 MG tablet Take 1 tablet by mouth every 6 (six) hours as needed. Patient taking differently: Take 1 tablet by mouth every 6 (six) hours as needed for moderate pain.  11/05/15  Yes Ankit Nanavati, MD  latanoprost (XALATAN) 0.005 % ophthalmic solution Place 1 drop into both eyes at bedtime.    Yes Historical Provider, MD    losartan-hydrochlorothiazide (HYZAAR) 50-12.5 MG per tablet Take 1 tablet by mouth daily. 10/24/14  Yes Nyoka CowdenMichael B Wert, MD  omeprazole (PRILOSEC) 20 MG capsule Take 20 mg by mouth daily.   Yes Historical Provider, MD  sodium chloride (OCEAN) 0.65 % SOLN nasal spray Place 1 spray into both nostrils as needed for congestion.   Yes Historical Provider, MD  timolol (TIMOPTIC) 0.5 % ophthalmic solution Place 1 drop into both eyes every morning. 07/26/15  Yes Historical Provider, MD  Vitamin D, Ergocalciferol, (DRISDOL) 50000 units CAPS capsule Take 50,000 Units by mouth once a week. 10/24/15  Yes Historical Provider, MD  amoxicillin-clavulanate (AUGMENTIN) 875-125 MG tablet Take 1 tablet by mouth 2 (two) times daily. One po bid x 7 days 09/08/16   Marily MemosJason Tameia Rafferty, MD    Family History History reviewed. No pertinent family history.  Social History Social History  Substance Use Topics  . Smoking status: Former Smoker    Packs/day: 1.00    Years: 33.00    Types: Cigarettes  . Smokeless tobacco: Never Used  . Alcohol use Yes     Comment: Every other day     Allergies   Neo-synephrine [oxymetazoline] and Prednisone   Review of Systems Review of Systems  Respiratory: Positive for cough.   Cardiovascular: Positive for chest pain.  All other systems reviewed  and are negative.    Physical Exam Updated Vital Signs BP 145/57 (BP Location: Left Arm)   Pulse 80   Temp 98.8 F (37.1 C) (Oral)   Resp 18   Ht 5' 3.5" (1.613 m)   Wt 139 lb 1.6 oz (63.1 kg)   SpO2 100%   BMI 24.25 kg/m   Physical Exam  Constitutional: She is oriented to person, place, and time. She appears well-developed and well-nourished.  HENT:  Head: Normocephalic and atraumatic.  Eyes: Conjunctivae and EOM are normal.  Neck: Normal range of motion.  Cardiovascular: Normal rate and regular rhythm.   Pulmonary/Chest: Effort normal. No stridor. No respiratory distress. She has wheezes.  Abdominal: She exhibits no  distension.  Musculoskeletal: Normal range of motion. She exhibits no edema or deformity.  Neurological: She is alert and oriented to person, place, and time.  Nursing note and vitals reviewed.    ED Treatments / Results  Labs (all labs ordered are listed, but only abnormal results are displayed) Labs Reviewed  BASIC METABOLIC PANEL - Abnormal; Notable for the following:       Result Value   Glucose, Bld 103 (*)    Calcium 8.4 (*)    All other components within normal limits  CBC - Abnormal; Notable for the following:    WBC 13.3 (*)    Hemoglobin 11.6 (*)    HCT 35.0 (*)    All other components within normal limits  I-STAT TROPOININ, ED    EKG  EKG Interpretation None       Radiology Dg Chest 2 View  Result Date: 09/08/2016 CLINICAL DATA:  Short of breath, sore throat EXAM: CHEST  2 VIEW COMPARISON:  Radiograph 07/24/2014 FINDINGS: Normal cardiac silhouette. Chronic bronchitic markings similar prior. Hyperinflated lungs. No effusion, infiltrate pneumothorax. Ill-defined opacity projecting over the LEFT lower lobe not seen on comparison exam. IMPRESSION: Chronic bronchitic markings and hyperinflated lungs. No clear acute findings. Ill-defined opacity projecting over the LEFT lower lobe. Cannot exclude a small focus of infection or pulmonary nodule. Consider CT thorax for further evaluation. Electronically Signed   By: Genevive Bi M.D.   On: 09/08/2016 17:47   Ct Chest W Contrast  Result Date: 09/08/2016 CLINICAL DATA:  Dyspnea and cough x1 week. EXAM: CT CHEST WITH CONTRAST TECHNIQUE: Multidetector CT imaging of the chest was performed during intravenous contrast administration. CONTRAST:  ISOVUE-300 IOPAMIDOL (ISOVUE-300) INJECTION 61% COMPARISON:  CXR 09/08/2016 and chest CT 03/02/2014 FINDINGS: Cardiovascular: The heart is top-normal in size. There is no pericardial effusion. Coronary arteriosclerosis is noted with aortic atherosclerosis. No aneurysm of the thoracic  aorta. Mediastinum/Nodes: 8 mm well-circumscribed noncalcified cyst of the right thyroid gland. No mediastinal adenopathy. Small borderline enlarged bilateral paratracheal, subcarinal the trachea is patent. The esophagus is nonacute. And right hilar lymph nodes are noted which may be reactive in etiology, the largest is right hilar 10 mm short axis measuring up to 9-10 mm. Lungs/Pleura: Confluent airspace opacity in the lingula may reflect a lingular pneumonia. Multilobar bilateral tree-in-bud and tiny nodular densities are noted, subpleural in etiology which may reflect bronchiolitis. Follow-up may prove useful to assure resolution. No dominant mass is identified. No effusion or pneumothorax. Upper Abdomen: Nonacute Musculoskeletal: Mid to lower thoracic spondylosis with multilevel degenerative disc disease. No acute osseous appearing abnormality. IMPRESSION: Confluent airspace opacity in the lingula which may reflect pneumonia and/or atelectasis. Scattered diffuse bilateral subpleural tree-in-bud and tiny nodular densities may reflect a postinfectious or postinflammatory etiology such as bronchiolitis.  Followup PA and lateral chest X-ray or repeat CT is recommended in 3-4 weeks following trial of antibiotic therapy to ensure resolution and exclude underlying malignancy. Electronically Signed   By: Tollie Eth M.D.   On: 09/08/2016 19:15    Procedures Procedures (including critical care time)  Medications Ordered in ED Medications  dexamethasone (DECADRON) tablet 10 mg (10 mg Oral Given 09/08/16 1900)  albuterol (PROVENTIL HFA;VENTOLIN HFA) 108 (90 Base) MCG/ACT inhaler 2 puff (2 puffs Inhalation Given 09/08/16 1850)  iopamidol (ISOVUE-300) 61 % injection (100 mLs  Contrast Given 09/08/16 1839)  amoxicillin-clavulanate (AUGMENTIN) 875-125 MG per tablet 1 tablet (1 tablet Oral Given 09/08/16 2046)     Initial Impression / Assessment and Plan / ED Course  I have reviewed the triage vital signs and the nursing  notes.  Pertinent labs & imaging results that were available during my care of the patient were reviewed by me and considered in my medical decision making (see chart for details).  Clinical Course     Patient appears well, no distress. Likely CAP. Already on azithromycin without significant improvement so will start augmentin to also cover for more typical bacterial causes of pneumonia. Stable for discharge otherwise. CP is related to coughing, low suspicion for cardiac causes at this time.   Final Clinical Impressions(s) / ED Diagnoses   Final diagnoses:  Community acquired pneumonia of left lung, unspecified part of lung    New Prescriptions Discharge Medication List as of 09/08/2016  8:36 PM    START taking these medications   Details  amoxicillin-clavulanate (AUGMENTIN) 875-125 MG tablet Take 1 tablet by mouth 2 (two) times daily. One po bid x 7 days, Starting Mon 09/08/2016, Print         Marily Memos, MD 09/09/16 1200

## 2016-10-06 ENCOUNTER — Ambulatory Visit (INDEPENDENT_AMBULATORY_CARE_PROVIDER_SITE_OTHER): Payer: Medicare Other | Admitting: Internal Medicine

## 2016-10-06 ENCOUNTER — Ambulatory Visit (INDEPENDENT_AMBULATORY_CARE_PROVIDER_SITE_OTHER)
Admission: RE | Admit: 2016-10-06 | Discharge: 2016-10-06 | Disposition: A | Discharge status: Home / Self Care | Payer: Medicare Other | Source: Ambulatory Visit | Attending: Internal Medicine | Admitting: Internal Medicine

## 2016-10-06 ENCOUNTER — Encounter: Payer: Self-pay | Admitting: Internal Medicine

## 2016-10-06 VITALS — BP 140/80 | HR 77 | Ht 63.5 in | Wt 140.0 lb

## 2016-10-06 DIAGNOSIS — R918 Other nonspecific abnormal finding of lung field: Secondary | ICD-10-CM | POA: Diagnosis not present

## 2016-10-06 MED ORDER — FAMOTIDINE 20 MG PO TABS: ORAL_TABLET | ORAL | Status: DC

## 2016-10-06 NOTE — Progress Notes (Signed)
Subjective:    Patient ID: Dana [facility], female    DOB: 12/10/1926   MRN: [id number]    Brief patient profile:  89  yowf quit smoking around 1986 with resolution of cough and able to yard work abruptly ill 06/25/13 of hoarsness and cough and chest tight then saw UC 06/26/13 and got worse with rx as bronchitis so [facility] by [facility] to [facility] on 07/01/13 -  improved off acei with only GOLD I copd by spirometry 02/24/2014.    History of Present Illness  07/01/2013 1st [facility] office visit/ [facility] cc abuptly ill p worked in yard 06/25/13 with persistent hoarseness, severe dry cough, no fever, no sob unless coughing rx with doxy.   Has saba but not using but after neb slt yellow mucus. rec Take delsym two tsp every 12 hours and supplement if needed with  tramadol 50 mg up to 2 every 4 hours to suppress the urge to cough.  Try prilosec 20mg   Take 30-60 min before first meal of the day and Pepcid 20 mg one bedtime until cough is completely gone for at least a week without the need for cough suppression Stop lisinopril Start benicar 20-12.5 one daily break in half if too strong   07/29/2013 f/u ov/Dana [facility] re:  MPN's/ cough  Chief Complaint  Patient presents with  . Follow-up    Pt states that her cough has resolved. No new co's today.   Not limited by sob. Some light headedness even on one half benicar rec Stop benicar Start losartan-hct 50/12.5 daily in place of benicar   02/24/2014 f/u ov/Dana [facility] re: MPNs/cough / ? Worse RUL density vs baseline cxr  Chief Complaint  Patient presents with  . Follow-up    6 mo follow up.--cxr last week. Pt reports cough is resolved.    Not limited by breathing from desired activities    rec CT chest c/w MAI   07/24/2014 f/u ov/Dana [facility] re: possible MAI  Chief Complaint  Patient presents with  . Follow-up    Pt states that she is doing well and has not had any cough since last visit.   Not limited by breathing from desired activities    rec MAI ( Mycobacterium Avium  Intracellulare) is a common colonizer and does not appear to be causing any active clinical infection so is best not treated Unless there is convincing evidence of chronic respiratory infection (persistent productive, worse exercise tolerance, or fever/sweats of unknown origin no need for additional cxr's or follow up here.      10/06/2016  f/u ov/Dana [facility] re:  Chief Complaint  Patient presents with  . [facility] Consult    Referred by Dr. Juluis RainierElizabeth Blake. Pt states that she was dxed with PNA 09/08/16. She states still feeling fatigued. She has had a sore throat for the past 2-3 days.   acutely ill 09/04/16 with flu like illness > UC  Zpak> worse sore throat / sweats / aching all over > 09/08/16 augmentin > sore throat worse first in am s much  Cough at all even when acutely ill     No obvious day to day or daytime variabilty or assoc sob  or cp or chest tightness, subjective wheeze overt sinus or hb symptoms. No unusual exp hx or h/o childhood pna/ asthma or knowledge of premature birth.  Sleeping ok without nocturnal  or early am exacerbation  of respiratory  c/o's or need for noct saba. Also denies any obvious fluctuation of symptoms with weather  or environmental changes or other aggravating or alleviating factors except as outlined above   Current Medications, Allergies, Complete Past Medical History, Past Surgical History, Family History, and Social History were reviewed in [employer] record.  ROS  The following are not active complaints unless bolded sore throat, dysphagia, dental problems, itching, sneezing,  nasal congestion or excess/ purulent secretions, ear ache,   fever, chills, sweats, unintended wt loss, pleuritic or exertional cp, hemoptysis,  orthopnea pnd or leg swelling, presyncope, palpitations, heartburn, abdominal pain, anorexia, nausea, vomiting, diarrhea  or change in bowel or urinary habits, change in stools or urine,  dysuria,hematuria,  rash, arthralgias, visual complaints, headache, numbness weakness or ataxia or problems with walking or coordination,  change in mood/affect or memory.            Objective:   Physical Exam   amb talkative  wf  nad  Vital signs reviewed  - Note on arrival 02 sats  98% on RA     02/24/2014        145   > 07/24/2014  149  > 10/06/2016  140  Wt Readings from Last 3 Encounters:  07/29/13 146 lb (66.225 kg)  07/01/13 146 lb (66.225 kg)  02/21/13 145 lb (65.772 kg)       HEENT: nl dentition, turbinates, and oropharynx which is pristine. Nl external ear canals without cough reflex   NECK :  without JVD/Nodes/TM/ nl carotid upstrokes bilaterally   LUNGS: no acc muscle use, clear to A and P bilaterally without cough on insp or exp maneuvers   CV:  RRR  no s3 or murmur or increase in P2, no edema   ABD:  soft and nontender with nl excursion in the supine position. No bruits or organomegaly, bowel sounds nl  MS:  warm without deformities, calf tenderness, cyanosis or clubbing  SKIN: warm and dry without lesions       CXR PA and Lateral:   10/06/2016 :    I personally reviewed images and agree with radiology impression as follows:   Partial clearing of the faint infiltrate in the lingula of the left lung.         Assessment & Plan:

## 2016-10-06 NOTE — Progress Notes (Signed)
Spoke with pt and notified of results per Dr. Wert. Pt verbalized understanding and denied any questions. 

## 2016-10-06 NOTE — Patient Instructions (Addendum)
Please remember to go to the   x-ray department downstairs in the basement  for your tests - we will call you with the results when they are available.  Add pepcid 20 mg at bedtime for any flare of resp symptoms  GERD (REFLUX)  is an extremely common cause of respiratory symptoms just like yours , many times with no obvious heartburn at all.    It can be treated with medication, but also with lifestyle changes including elevation of the head of your bed (ideally with 6 inch  bed blocks),  Smoking cessation, avoidance of late meals, excessive alcohol, and avoid fatty foods, chocolate, peppermint, colas, red wine, and acidic juices such as orange juice.  NO MINT OR MENTHOL PRODUCTS SO NO COUGH DROPS  USE SUGARLESS CANDY INSTEAD (Jolley ranchers or Stover's or Life Savers) or even ice chips will also do - the key is to swallow to prevent all throat clearing. NO OIL BASED VITAMINS - use powdered substitutes.  Pulmonary follow up is as needed

## 2016-10-12 NOTE — Assessment & Plan Note (Signed)
CT chest 06/30/13 The spectrum of findings within the lungs are consistent with a  chronic atypical indolent infection. 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 Rounded nodule in the right upper lobe does not have the typical  tree in bud configuration of bronchiolitis. - cxr 02/09/14 > increased density  RUL> repeat CT chest 03/02/2014 > nodule resolved, MAI changes persist - f/u cxr 07/24/2014  No RUL nodules, now ? Tiny L lateral nodules   She has minimal residual changes in Lingula with symptoms that are more typical of noct gerd so rec no further abx/ add pepcid at hs and return here if not 100% back to baseline  Each maintenance medication was reviewed in detail including most importantly the difference between maintenance and as needed and under what circumstances the prns are to be used.  Please see AVS for specific  Instructions which are unique to this visit and I personally typed out  which were reviewed in detail in writing with the patient and a copy provided.

## 2016-12-17 ENCOUNTER — Other Ambulatory Visit: Payer: Self-pay | Admitting: Family Medicine

## 2016-12-17 DIAGNOSIS — Z1231 Encounter for screening mammogram for malignant neoplasm of breast: Secondary | ICD-10-CM

## 2017-02-18 ENCOUNTER — Ambulatory Visit
Admission: RE | Admit: 2017-02-18 | Discharge: 2017-02-18 | Disposition: A | Discharge status: Home / Self Care | Payer: Medicare Other | Source: Ambulatory Visit | Attending: Family Medicine | Admitting: Family Medicine

## 2017-02-18 DIAGNOSIS — Z1231 Encounter for screening mammogram for malignant neoplasm of breast: Secondary | ICD-10-CM

## 2017-12-25 ENCOUNTER — Other Ambulatory Visit: Payer: Self-pay | Admitting: Family Medicine

## 2017-12-25 DIAGNOSIS — Z139 Encounter for screening, unspecified: Secondary | ICD-10-CM

## 2018-01-22 ENCOUNTER — Ambulatory Visit: Payer: Medicare Other | Admitting: Podiatry

## 2018-02-19 ENCOUNTER — Encounter (HOSPITAL_COMMUNITY): Payer: Self-pay

## 2018-02-19 ENCOUNTER — Emergency Department (HOSPITAL_COMMUNITY)
Admission: EM | Admit: 2018-02-19 | Discharge: 2018-02-19 | Disposition: A | Discharge status: Home / Self Care | Payer: Medicare Other | Attending: Emergency Medicine | Admitting: Emergency Medicine

## 2018-02-19 DIAGNOSIS — Z79899 Other long term (current) drug therapy: Secondary | ICD-10-CM | POA: Diagnosis not present

## 2018-02-19 DIAGNOSIS — I10 Essential (primary) hypertension: Secondary | ICD-10-CM | POA: Insufficient documentation

## 2018-02-19 DIAGNOSIS — J449 Chronic obstructive pulmonary disease, unspecified: Secondary | ICD-10-CM | POA: Diagnosis not present

## 2018-02-19 DIAGNOSIS — Z87891 Personal history of nicotine dependence: Secondary | ICD-10-CM | POA: Diagnosis not present

## 2018-02-19 DIAGNOSIS — R1030 Lower abdominal pain, unspecified: Secondary | ICD-10-CM | POA: Diagnosis present

## 2018-02-19 LAB — URINALYSIS, ROUTINE W REFLEX MICROSCOPIC
BACTERIA UA: NONE SEEN
BILIRUBIN URINE: NEGATIVE
Glucose, UA: NEGATIVE mg/dL
KETONES UR: NEGATIVE mg/dL
NITRITE: NEGATIVE
PH: 7 (ref 5.0–8.0)
Protein, ur: NEGATIVE mg/dL
SPECIFIC GRAVITY, URINE: 1.009 (ref 1.005–1.030)

## 2018-02-19 LAB — COMPREHENSIVE METABOLIC PANEL
ALK PHOS: 89 U/L (ref 38–126)
ALT: 12 U/L — ABNORMAL LOW (ref 14–54)
ANION GAP: 7 (ref 5–15)
AST: 17 U/L (ref 15–41)
Albumin: 3.8 g/dL (ref 3.5–5.0)
BILIRUBIN TOTAL: 0.4 mg/dL (ref 0.3–1.2)
BUN: 11 mg/dL (ref 6–20)
CALCIUM: 9 mg/dL (ref 8.9–10.3)
CO2: 29 mmol/L (ref 22–32)
Chloride: 104 mmol/L (ref 101–111)
Creatinine, Ser: 0.69 mg/dL (ref 0.44–1.00)
GFR calc Af Amer: >60 mL/min (ref >60)
Glucose, Bld: 91 mg/dL (ref 65–99)
POTASSIUM: 3.7 mmol/L (ref 3.5–5.1)
Sodium: 140 mmol/L (ref 135–145)
TOTAL PROTEIN: 7.5 g/dL (ref 6.5–8.1)

## 2018-02-19 LAB — CBC
HEMATOCRIT: 41.5 % (ref 36.0–46.0)
HEMOGLOBIN: 13.5 g/dL (ref 12.0–15.0)
MCH: 28.4 pg (ref 26.0–34.0)
MCHC: 32.5 g/dL (ref 30.0–36.0)
MCV: 87.4 fL (ref 78.0–100.0)
Platelets: 342 10*3/uL (ref 150–400)
RBC: 4.75 MIL/uL (ref 3.87–5.11)
RDW: 13.5 % (ref 11.5–15.5)
WBC: 6.3 10*3/uL (ref 4.0–10.5)

## 2018-02-19 LAB — LIPASE, BLOOD: Lipase: 33 U/L (ref 11–51)

## 2018-02-19 LAB — POC OCCULT BLOOD, ED: Fecal Occult Bld: NEGATIVE

## 2018-02-19 NOTE — ED Triage Notes (Signed)
Patient c/o lower abdominal pain, bloated. Patient states she has taken prilosec, Tums, and Zantac with no relief. Patient denies any N/V, but states she had diarrhea x 4 (small amounts) last night.

## 2018-02-19 NOTE — Discharge Instructions (Signed)
You were evaluated in the emergency department for low abdominal pain with diarrhea.  Your lab work did not show an obvious cause of your symptoms and you were feeling better here.  Please continue to take your regular medications and follow-up with your primary care doctor.  Return if any concerns.

## 2018-02-19 NOTE — ED Notes (Signed)
Patient refusing IV access at this time.

## 2018-02-19 NOTE — ED Provider Notes (Signed)
[facility]   CSN: [id number] Arrival date & time: 02/19/18  1140     History   Chief Complaint Chief Complaint  Patient presents with  . Abdominal Pain    HPI Dana Blake is a 84 y.o. female.  [age] year old female presents to the emergency room with low abdominal pain moderate severity since last night.  Was associated with 4 episodes of nonbloody non-melanotic diarrhea.  Pain was also resolved when she woke up this morning but she felt a fast heart rate and she was concerned because she had a prior history of a GI bleed about 20 years ago and it felt similar to this.  Pain is 0 out of 10 now and she only states her stomach feels a little queasy.  No fevers no chills no chest pain no shortness of breath.  No urinary symptoms.  Is been no syncope.  The history is provided by the patient and a relative.  Abdominal Pain   This is a new problem. The current episode started 12 to 24 hours ago. The problem has been resolved. The pain is associated with an unknown factor. The pain is located in the RLQ and suprapubic region. The pain is moderate. Associated symptoms include diarrhea. Pertinent negatives include fever, hematochezia, melena, nausea, vomiting, constipation, dysuria, frequency, hematuria and headaches. Nothing aggravates the symptoms. Nothing relieves the symptoms.    Past Medical History:  Diagnosis Date  . History of bleeding ulcers   . Hyperlipidemia   . Hypertension   . Macular degeneration     Patient Active Problem List   Diagnosis Date Noted  . COPD  GOLD I 02/24/2014  . Multiple pulmonary nodules c/w MAI clinically  07/29/2013  . Cough 07/03/2013  . PEPTIC ULCER DISEASE 09/08/2007  . HYPERTENSION NEC 09/08/2007    Past Surgical History:  Procedure Laterality Date  . APPENDECTOMY    . BACK SURGERY    . FINGER SURGERY     Left Little Finger     OB History   None      Home Medications    Prior to  Admission medications   Medication Sig Start Date End Date Taking? Authorizing Provider  beta carotene w/minerals (OCUVITE) tablet Take 1 tablet by mouth 2 (two) times daily.     [provider]  famotidine (PEPCID) 20 MG tablet One at bedtime 10/06/16   Nyoka CowdenWert, Michael B, MD  latanoprost (XALATAN) 0.005 % ophthalmic solution Place 1 drop into both eyes at bedtime.     [provider]  losartan-hydrochlorothiazide (HYZAAR) 50-12.5 MG per tablet Take 1 tablet by mouth daily. 10/24/14   Nyoka CowdenWert, Michael B, MD  omeprazole (PRILOSEC) 20 MG capsule Take 20 mg by mouth daily.    [provider]  sodium chloride (OCEAN) 0.65 % SOLN nasal spray Place 1 spray into both nostrils as needed for congestion.    [provider]  timolol (TIMOPTIC) 0.5 % ophthalmic solution Place 1 drop into both eyes every morning. 07/26/15   [provider]  Vitamin D, Ergocalciferol, (DRISDOL) 50000 units CAPS capsule Take 50,000 Units by mouth once a week. 10/24/15   [provider]    Family History History reviewed. No pertinent family history.  Social History Social History   Tobacco Use  . Smoking status: Former Smoker    Packs/day: 1.00    Years: 33.00    Pack years: 33.00    Types: Cigarettes  . Smokeless tobacco: Never Used  Substance Use Topics  . Alcohol use: Yes    Comment: Every other day  . Drug use: No     Allergies   Neo-synephrine [oxymetazoline] and Prednisone   Review of Systems Review of Systems  Constitutional: Negative for fever.  HENT: Negative for nosebleeds and sore throat.   Eyes: Negative for visual disturbance.  Respiratory: Negative for shortness of breath.   Cardiovascular: Positive for palpitations. Negative for chest pain.  Gastrointestinal: Positive for abdominal pain and diarrhea. Negative for constipation, hematochezia, melena, nausea and vomiting.  Genitourinary: Negative for dysuria, frequency and hematuria.    Musculoskeletal: Negative for back pain and neck pain.  Skin: Negative for rash.  Neurological: Negative for headaches.     Physical Exam Updated Vital Signs BP (!) 175/89 (BP Location: Right Arm)   Pulse 72   Temp 98.2 F (36.8 C) (Oral)   Resp 17   Ht 5' 3.5" (1.613 m)   Wt 64 kg (141 lb)   SpO2 97%   BMI 24.59 kg/m   Physical Exam  Constitutional: She appears well-developed and well-nourished. No distress.  HENT:  Head: Normocephalic and atraumatic.  Eyes: Conjunctivae are normal.  Neck: Neck supple.  Cardiovascular: Normal rate and regular rhythm.  No murmur heard. Pulmonary/Chest: Effort normal and breath sounds normal. No respiratory distress.  Abdominal: Soft. There is no tenderness.  Genitourinary: Rectum normal. Rectal exam shows no fissure, no mass and no tenderness.  Genitourinary Comments: Chaperone was present during exam.   Musculoskeletal: She exhibits no edema, tenderness or deformity.  Neurological: She is alert. She has normal strength. No sensory deficit. GCS eye subscore is 4. GCS verbal subscore is 5. GCS motor subscore is 6.  Skin: Skin is warm and dry.  Psychiatric: She has a normal mood and affect.  Nursing note and vitals reviewed.    ED Treatments / Results  Labs (all labs ordered are listed, but only abnormal results are displayed) Labs Reviewed  COMPREHENSIVE METABOLIC PANEL - Abnormal; Notable for the following components:      Result Value   ALT 12 (*)    All other components within normal limits  URINALYSIS, ROUTINE W REFLEX MICROSCOPIC - Abnormal; Notable for the following components:   Color, Urine STRAW (*)    Hgb urine dipstick SMALL (*)    Leukocytes, UA TRACE (*)    All other components within normal limits  LIPASE, BLOOD  CBC  POC OCCULT BLOOD, ED    EKG None  Radiology No results found.  Procedures Procedures (including critical care time)  Medications Ordered in ED Medications - No data to  display   Initial Impression / Assessment and Plan / ED Course  I have reviewed the triage vital signs and the nursing notes.  Pertinent labs & imaging results that were available during my care of the patient were reviewed by me and considered in my medical decision making (see chart for details).   patients pain resolved. hct stable, heme negative from below. She wants to leave and will followup with her pcp. With her benign exam I think this is reasonable and she understands to return if any recurrence of symptoms.   Final Clinical Impressions(s) / ED Diagnoses   Final diagnoses:  Lower abdominal pain    ED Discharge Orders    None       Terrilee Files, MD 02/20/18 1034

## 2018-02-19 NOTE — ED Notes (Signed)
Patient verbalized understanding of discharge instructions, no questions. Patient ambulated out of ED with steady gait in no distress.  

## 2018-02-22 ENCOUNTER — Ambulatory Visit
Admission: RE | Admit: 2018-02-22 | Discharge: 2018-02-22 | Disposition: A | Discharge status: Home / Self Care | Payer: Medicare Other | Source: Ambulatory Visit | Attending: Family Medicine | Admitting: Family Medicine

## 2018-02-22 DIAGNOSIS — Z139 Encounter for screening, unspecified: Secondary | ICD-10-CM

## 2018-02-23 ENCOUNTER — Encounter: Payer: Self-pay | Admitting: Gastroenterology

## 2018-04-28 ENCOUNTER — Ambulatory Visit: Payer: Medicare Other | Admitting: Gastroenterology

## 2018-09-17 ENCOUNTER — Encounter (HOSPITAL_BASED_OUTPATIENT_CLINIC_OR_DEPARTMENT_OTHER): Payer: Self-pay | Admitting: *Deleted

## 2018-09-17 ENCOUNTER — Emergency Department (HOSPITAL_BASED_OUTPATIENT_CLINIC_OR_DEPARTMENT_OTHER): Payer: Medicare Other

## 2018-09-17 ENCOUNTER — Other Ambulatory Visit: Payer: Self-pay

## 2018-09-17 ENCOUNTER — Emergency Department (HOSPITAL_BASED_OUTPATIENT_CLINIC_OR_DEPARTMENT_OTHER)
Admission: EM | Admit: 2018-09-17 | Discharge: 2018-09-17 | Disposition: A | Discharge status: Home / Self Care | Payer: Medicare Other | Attending: Emergency Medicine | Admitting: Emergency Medicine

## 2018-09-17 DIAGNOSIS — Y998 Other external cause status: Secondary | ICD-10-CM | POA: Insufficient documentation

## 2018-09-17 DIAGNOSIS — Z87891 Personal history of nicotine dependence: Secondary | ICD-10-CM | POA: Diagnosis not present

## 2018-09-17 DIAGNOSIS — Z79899 Other long term (current) drug therapy: Secondary | ICD-10-CM | POA: Diagnosis not present

## 2018-09-17 DIAGNOSIS — S0990XA Unspecified injury of head, initial encounter: Secondary | ICD-10-CM | POA: Insufficient documentation

## 2018-09-17 DIAGNOSIS — Y92481 Parking lot as the place of occurrence of the external cause: Secondary | ICD-10-CM | POA: Insufficient documentation

## 2018-09-17 DIAGNOSIS — R51 Headache: Secondary | ICD-10-CM

## 2018-09-17 DIAGNOSIS — R519 Headache, unspecified: Secondary | ICD-10-CM

## 2018-09-17 DIAGNOSIS — E785 Hyperlipidemia, unspecified: Secondary | ICD-10-CM | POA: Insufficient documentation

## 2018-09-17 DIAGNOSIS — Y9389 Activity, other specified: Secondary | ICD-10-CM | POA: Diagnosis not present

## 2018-09-17 DIAGNOSIS — I10 Essential (primary) hypertension: Secondary | ICD-10-CM | POA: Insufficient documentation

## 2018-09-17 MED ORDER — ACETAMINOPHEN 325 MG PO TABS
650.0000 mg | ORAL_TABLET | Freq: Once | ORAL | Status: AC
  Administered 2018-09-17: 650 mg via ORAL
  Filled 2018-09-17: qty 2

## 2018-09-17 NOTE — Discharge Instructions (Addendum)
Apply ice to help with the swelling, take Tylenol as needed for pain
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
## 2019-04-08 ENCOUNTER — Other Ambulatory Visit: Payer: Self-pay | Admitting: Family Medicine

## 2019-04-08 DIAGNOSIS — Z1231 Encounter for screening mammogram for malignant neoplasm of breast: Secondary | ICD-10-CM

## 2019-05-23 ENCOUNTER — Other Ambulatory Visit: Payer: Self-pay

## 2019-05-23 ENCOUNTER — Ambulatory Visit
Admission: RE | Admit: 2019-05-23 | Discharge: 2019-05-23 | Disposition: A | Discharge status: Home / Self Care | Payer: Medicare Other | Source: Ambulatory Visit | Attending: Family Medicine | Admitting: Family Medicine

## 2019-05-23 DIAGNOSIS — Z1231 Encounter for screening mammogram for malignant neoplasm of breast: Secondary | ICD-10-CM

## 2019-10-03 ENCOUNTER — Ambulatory Visit: Payer: Medicare Other | Attending: Internal Medicine

## 2019-10-03 DIAGNOSIS — Z23 Encounter for immunization: Secondary | ICD-10-CM | POA: Insufficient documentation

## 2019-10-03 NOTE — Progress Notes (Signed)
   Covid-19 Vaccination Clinic  Name:  Dana Blake    MRN: [id number] DOB: 02-11-1927  10/03/2019  [name] was observed post Covid-19 immunization for 15 minutes without incidence. She was provided with Vaccine Information Sheet and instruction to access the V-Safe system.   [name] was instructed to call 911 with any severe reactions post vaccine: Marland Kitchen Difficulty breathing  . Swelling of your face and throat  . A fast heartbeat  . A bad rash all over your body  . Dizziness and weakness    Immunizations Administered    Name Date Dose VIS Date Route   Pfizer COVID-19 Vaccine 10/03/2019  1:41 PM 0.3 mL 08/19/2019 Intramuscular   Manufacturer: [employer], [employer]   Lot: [id number]   NDC: [address]-3

## 2019-10-24 ENCOUNTER — Ambulatory Visit: Payer: Medicare Other | Attending: Internal Medicine

## 2019-10-24 DIAGNOSIS — Z23 Encounter for immunization: Secondary | ICD-10-CM

## 2019-10-24 NOTE — Progress Notes (Signed)
   Covid-19 Vaccination Clinic  Name:  KRISTY SCHOMBURG    MRN: [id number] DOB: 07-12-1927  10/24/2019  Ms. Angulo was observed post Covid-19 immunization for 15 minutes without incidence. She was provided with Vaccine Information Sheet and instruction to access the V-Safe system.   Ms. Loseke was instructed to call 911 with any severe reactions post vaccine: Marland Kitchen Difficulty breathing  . Swelling of your face and throat  . A fast heartbeat  . A bad rash all over your body  . Dizziness and weakness    Immunizations Administered    Name Date Dose VIS Date Route   Pfizer COVID-19 Vaccine 10/24/2019 12:05 PM 0.3 mL 08/19/2019 Intramuscular   Manufacturer: [employer], [employer]   Lot: [id number]   NDC: [address]-2

## 2019-12-19 ENCOUNTER — Encounter: Payer: Self-pay | Admitting: Nurse Practitioner

## 2019-12-29 ENCOUNTER — Encounter: Payer: Self-pay | Admitting: Nurse Practitioner

## 2019-12-29 ENCOUNTER — Other Ambulatory Visit (INDEPENDENT_AMBULATORY_CARE_PROVIDER_SITE_OTHER): Payer: Medicare Other

## 2019-12-29 ENCOUNTER — Ambulatory Visit: Payer: Medicare Other | Admitting: Nurse Practitioner

## 2019-12-29 VITALS — BP 120/70 | HR 78 | Temp 97.9°F | Ht 63.5 in | Wt 137.0 lb

## 2019-12-29 DIAGNOSIS — Z8619 Personal history of other infectious and parasitic diseases: Secondary | ICD-10-CM

## 2019-12-29 DIAGNOSIS — Z8719 Personal history of other diseases of the digestive system: Secondary | ICD-10-CM

## 2019-12-29 DIAGNOSIS — R1013 Epigastric pain: Secondary | ICD-10-CM | POA: Diagnosis not present

## 2019-12-29 LAB — CBC WITH DIFFERENTIAL/PLATELET
Basophils Absolute: 0.1 10*3/uL (ref 0.0–0.1)
Basophils Relative: 1 % (ref 0.0–3.0)
Eosinophils Absolute: 0.2 10*3/uL (ref 0.0–0.7)
Eosinophils Relative: 2.7 % (ref 0.0–5.0)
HCT: 40 % (ref 36.0–46.0)
Hemoglobin: 13.3 g/dL (ref 12.0–15.0)
Lymphocytes Relative: 26.8 % (ref 12.0–46.0)
Lymphs Abs: 2.5 10*3/uL (ref 0.7–4.0)
MCHC: 33.2 g/dL (ref 30.0–36.0)
MCV: 86.3 fl (ref 78.0–100.0)
Monocytes Absolute: 0.9 10*3/uL (ref 0.1–1.0)
Monocytes Relative: 9.9 % (ref 3.0–12.0)
Neutro Abs: 5.5 10*3/uL (ref 1.4–7.7)
Neutrophils Relative %: 59.6 % (ref 43.0–77.0)
Platelets: 350 10*3/uL (ref 150.0–400.0)
RBC: 4.63 Mil/uL (ref 3.87–5.11)
RDW: 13.4 % (ref 11.5–15.5)
WBC: 9.3 10*3/uL (ref 4.0–10.5)

## 2019-12-29 LAB — COMPREHENSIVE METABOLIC PANEL
ALT: 6 U/L (ref 0–35)
AST: 12 U/L (ref 0–37)
Albumin: 4 g/dL (ref 3.5–5.2)
Alkaline Phosphatase: 86 U/L (ref 39–117)
BUN: 16 mg/dL (ref 6–23)
CO2: 33 mEq/L — ABNORMAL HIGH (ref 19–32)
Calcium: 9.2 mg/dL (ref 8.4–10.5)
Chloride: 98 mEq/L (ref 96–112)
Creatinine, Ser: 0.7 mg/dL (ref 0.40–1.20)
GFR: 78.4 mL/min (ref >60.00)
Glucose, Bld: 88 mg/dL (ref 70–99)
Potassium: 4 mEq/L (ref 3.5–5.1)
Sodium: 137 mEq/L (ref 135–145)
Total Bilirubin: 0.3 mg/dL (ref 0.2–1.2)
Total Protein: 7.3 g/dL (ref 6.0–8.3)

## 2019-12-29 MED ORDER — FAMOTIDINE 20 MG PO TABS: 20.0000 mg | ORAL_TABLET | Freq: Every day | ORAL | 1 refills | Status: DC

## 2019-12-29 NOTE — Patient Instructions (Signed)
If you are age 84 or older, your body mass index should be between 23-30. Your Body mass index is 23.89 kg/m. If this is out of the aforementioned range listed, please consider follow up with your Primary Care Provider.  If you are age 83 or younger, your body mass index should be between 19-25. Your Body mass index is 23.89 kg/m. If this is out of the aformentioned range listed, please consider follow up with your Primary Care Provider.    1. Continue taking Omeprazole 20 mg daily  2. Pick up from your pharmacy famotidine 20mg  and take 1 tablet at bedtime 3. Use GasX or phayzyme 1 at bedtime Call if your symptoms worsen.  Due to recent changes in healthcare laws, you may see the results of your imaging and laboratory studies on MyChart before your provider has had a chance to review them.  We understand that in some cases there may be results that are confusing or concerning to you. Not all laboratory results come back in the same time frame and the provider may be waiting for multiple results in order to interpret others.  Please give 48 hours in order for your provider to thoroughly review all the results before contacting the office for clarification of your results.   Thank you for choosing [facility] [place], CRNP

## 2019-12-29 NOTE — Progress Notes (Signed)
Agree with assessment and plan as outlined.  

## 2019-12-29 NOTE — Progress Notes (Signed)
12/29/2019 Dana Blake [id number] [birth year]   CHIEF COMPLAINT:  Increased burping, concerned she might have an ulcer   HISTORY OF PRESENT ILLNESS: Dana Blake is a 86 year old female with a past medical history of anxiety, depression, hypertension, legally blind secondary to macular degeneration, glaucoma, upper GI bleed with hematemesis in the 1980s secondary to H. pylori gastric ulcer, previously followed by Dr. Dickie La.  She presents today accompanied by her daughter for further evaluation regarding increased burping/belching.  She is concerned her prior stomach ulcer has returned.  She denies having any dysphagia or heartburn.  She denies having any stomach pain but specifically describes having epigastric discomfort which feels like gas which occurs mostly at night and is interfering with her sleep.  She had similar symptoms in 2017 and she underwent H. pylori serology testing which apparently was positive and Dr. Zachery Dauer treated her with antibiotics and Prilosec and her symptoms improved.  She is concerned her H. pylori may have recurred at this time.  She denies taking aspirin or other NSAIDs. She takes Prilosec 20 mg once daily.  She is passing a normal brown formed bowel movement for which she can see.  No obvious rectal bleeding or black stool.  Her appetite is good.  She is lost 5 pounds over the past 5 months.  No fever, sweats or chills. She reported having 1 colonoscopy by  Dr. Juanda Chance which she reported was normal, possibly was done in the 1980's. No family history of gastric or colon cancer. Daughter with history of colon polyps.    Past Medical History:  Diagnosis Date  . Anal fissure   . Anemia   . Anxiety   . Arthritis   . Depression   . Glaucoma   . Hearing impairment   . History of bleeding ulcers   . Hyperlipidemia   . Hypertension   . Legally blind   . Macular degeneration   . Macular degeneration   . Stomach ulcer   . Thyroid disease   . UTI (urinary  tract infection)    Past Surgical History:  Procedure Laterality Date  . APPENDECTOMY    . BACK SURGERY    . FINGER SURGERY     Left Little Finger   Social History: She is widowed. She has 1 son and 3 daughters. Occasional glass of white wine.   reports that she has quit smoking. Her smoking use included cigarettes. She has a 33.00 pack-year smoking history. She has never used smokeless tobacco. She reports current alcohol use. She reports that she does not use drugs.   Family History: granddaughter with ulcerative colitis. Daughter with history of colon polyps. Brother with prostate cancer.    Allergies  Allergen Reactions  . Neo-Synephrine [Oxymetazoline] Other (See Comments)    hyper  . Prednisone Other (See Comments)    hyper      Outpatient Encounter Medications as of 12/29/2019  Medication Sig  . aluminum-magnesium hydroxide-simethicone (MAALOX) 200-200-20 MG/5ML SUSP Take 30 mLs by mouth as needed.  . cholecalciferol (VITAMIN D3) 25 MCG (1000 UNIT) tablet Take 1,000 Units by mouth daily.  . fluticasone (FLONASE) 50 MCG/ACT nasal spray Place 2 sprays into both nostrils daily.  Marland Kitchen latanoprost (XALATAN) 0.005 % ophthalmic solution Place 1 drop into both eyes at bedtime.   Marland Kitchen losartan-hydrochlorothiazide (HYZAAR) 50-12.5 MG per tablet Take 1 tablet by mouth daily.  Marland Kitchen omeprazole (PRILOSEC) 20 MG capsule Take 20 mg by mouth daily.  . sodium chloride (  OCEAN) 0.65 % SOLN nasal spray Place 1 spray into both nostrils as needed for congestion.  . timolol (TIMOPTIC) 0.5 % ophthalmic solution Place 1 drop into both eyes every morning.  . [DISCONTINUED] beta carotene w/minerals (OCUVITE) tablet Take 1 tablet by mouth 2 (two) times daily.   . [DISCONTINUED] famotidine (PEPCID) 20 MG tablet One at bedtime  . [DISCONTINUED] Vitamin D, Ergocalciferol, (DRISDOL) 50000 units CAPS capsule Take 50,000 Units by mouth once a week.   No facility-administered encounter medications on file as of  12/29/2019.     REVIEW OF SYSTEMS: All other systems reviewed and negative except where noted in the History of Present Illness.   PHYSICAL EXAM: BP 120/70   Pulse 78   Temp 97.9 F (36.6 C)   Ht 5' 3.5" (1.613 m)   Wt 137 lb (62.1 kg)   BMI 23.89 kg/m   General: Well developed 86 year old female in no acute distress. Head: Normocephalic and atraumatic. Eyes:  Sclerae non-icteric, conjunctive pink. Ears: HOH.  Mouth: Dentition intact. No ulcers or lesions.  Neck: Supple, no lymphadenopathy or thyromegaly.  Lungs: Clear bilaterally to auscultation without wheezes, crackles or rhonchi. Heart: Regular rate and rhythm. No murmur, rub or gallop appreciated.  Abdomen: Soft, nontender, non distended. No masses. No hepatosplenomegaly. Normoactive bowel sounds x 4 quadrants.  Rectal: Deferred.  Musculoskeletal: Symmetrical with no gross deformities. Skin: Warm and dry. No rash or lesions on visible extremities. Extremities: Trace edema to ankles.  Neurological: Alert oriented x 4, no focal deficits.  Psychological:  Alert and cooperative. Normal mood and affect.  ASSESSMENT AND PLAN:  53. 86 year old female with a past medical history of UGI bleeding secondary to H. Pylori gastric ulcers in the 1980's. + H. Pylori ab in 2017 treated with antibiotic and Omeprazole by PCP. She presents with complaints of increased burping/belching and epigastric gas discomfort.  -CBC, CMP, H. Pylori stool antigen  -Hold Omeprazole for 2 weeks then complete H. Pylori stool antigen test -Start Famotidine 20mg  one tab at bed time after H. Pylori test completed  -Ok to start Gas X or Phayzyme one tab at bed time now -Patient to call office if her symptoms worsen  -EGD if symptoms worsen  -Avoid spicy and acidic foods     CC:  Leighton Ruff, MD

## 2019-12-30 ENCOUNTER — Other Ambulatory Visit: Payer: Medicare Other

## 2019-12-30 DIAGNOSIS — Z8619 Personal history of other infectious and parasitic diseases: Secondary | ICD-10-CM

## 2019-12-30 DIAGNOSIS — Z8719 Personal history of other diseases of the digestive system: Secondary | ICD-10-CM

## 2020-01-02 LAB — HELICOBACTER PYLORI  SPECIAL ANTIGEN
MICRO NUMBER:: 10399638
SPECIMEN QUALITY: ADEQUATE

## 2020-01-05 ENCOUNTER — Telehealth: Payer: Self-pay | Admitting: Nurse Practitioner

## 2020-01-06 NOTE — Telephone Encounter (Signed)
Spoke with Dana Blake regarding her h. Pylori test. She states she is unable to go off of her PPI for 2 weeks. The patient also states she was not told to go off of it for 2 weeks before having the stool test. She would like to speak with you regarding this test and come up with another plan.

## 2020-01-09 NOTE — Telephone Encounter (Signed)
Patient called to follow up on previous message  

## 2020-01-11 NOTE — Telephone Encounter (Signed)
Pt's daughter called back and requested to call pt early morning as her hearing aids work best then.  Please contact pt at home number.

## 2020-01-11 NOTE — Telephone Encounter (Signed)
I called the patient, could not leave voicemail. I called her daughter and left a msg asking for mother's sx update. I would not pursue the H. Pylori testing at this point. Daughter to verify if pt is taking Omeprazole, Famotidine and Gas X. Await return call.

## 2020-01-12 NOTE — Telephone Encounter (Signed)
Pt called back and would like to speak to a nurse.  She is anxious and has not spoken to [place].  Please return call on pt's cell phone.

## 2020-01-12 NOTE — Telephone Encounter (Signed)
Spoke with the daughter and the patient on speaker phone. Daughter, Dana Blake, is also on the [facility]. The patient is taking Omeprazole, Famotidine and uses Gas-X. I confirmed this. She states she feels okay "mostly." The daughter asks if she could be treated empirically for H Pylori. Thanks

## 2020-01-12 NOTE — Telephone Encounter (Signed)
Try patient's home phone first [phone]. Then you may try the daughter's cell phone if there is not an answer [phone].

## 2020-01-12 NOTE — Telephone Encounter (Signed)
I called the patient, she stated she was feeling much better on the Omeprazole, Famotidine and Gas X. I did not recommend treating empirically for H. Pylori as the treatment could cause worse side effects and it is best not to take any antibiotic unless we know exactly what we are treating plus she if feeling much better on the above regimen. She has a follow up appt in office on 5/23. She will call if her sx worsen.

## 2020-01-20 ENCOUNTER — Other Ambulatory Visit: Payer: Self-pay | Admitting: Nurse Practitioner

## 2020-02-01 ENCOUNTER — Ambulatory Visit: Payer: Medicare Other | Admitting: Nurse Practitioner

## 2020-02-01 ENCOUNTER — Encounter: Payer: Self-pay | Admitting: Nurse Practitioner

## 2020-02-01 VITALS — BP 126/64 | HR 68 | Ht 63.5 in | Wt 136.6 lb

## 2020-02-01 DIAGNOSIS — R142 Eructation: Secondary | ICD-10-CM | POA: Diagnosis not present

## 2020-02-01 DIAGNOSIS — R141 Gas pain: Secondary | ICD-10-CM | POA: Diagnosis not present

## 2020-02-01 NOTE — Patient Instructions (Signed)
If you are age 84 or older, your body mass index should be between 23-30. Your Body mass index is 23.82 kg/m. If this is out of the aforementioned range listed, please consider follow up with your Primary Care Provider.  If you are age 58 or younger, your body mass index should be between 19-25. Your Body mass index is 23.82 kg/m. If this is out of the aformentioned range listed, please consider follow up with your Primary Care Provider.    Please purchase the following medications over the counter and take as directed: Continue omeprazole 20 mg in the morning. Famotidine 20mg  at bedtime. Gas X 1 by mouth at bedtime.  You may reduce your famotidine 20 mg to 1 tablet every other night if tolerated. Follow up with your PCP in 3 months for weight check.  Due to recent changes in healthcare laws, you may see the results of your imaging and laboratory studies on MyChart before your provider has had a chance to review them.  We understand that in some cases there may be results that are confusing or concerning to you. Not all laboratory results come back in the same time frame and the provider may be waiting for multiple results in order to interpret others.  Please give 48 hours in order for your provider to thoroughly review all the results before contacting the office for clarification of your results.   Thank you for choosing [facility], CRNP

## 2020-02-01 NOTE — Progress Notes (Signed)
Agree with assessment and plan as outlined.  

## 2020-02-01 NOTE — Progress Notes (Signed)
02/01/2020 Dana Blake [id number] 09-Nov-1926   Chief Complaint: follow up regarding burping and upper abdominal gas discomfort   History of Present Illness: Dana Blake is a 86 year old female with a past medical history of anxiety, depression, hypertension, legally blind secondary to macular degeneration, glaucoma, upper GI bleed with hematemesis in the 1980s secondary to H. pylori gastric ulcer, previously followed by Dr. Maurene Capes. I saw the patient in office on 12/29/2019 with complaints of increased burping and vague epigastric gas discomfort.  H. Pylori dual antigen testing was not completed as she did not wish to stop omeprazole for 2 weeks in order to obtain this test result.  Her belching and epigastric gas discomfort completely resolved after she took Gas-X 1 tab and famotidine 20 mg 1 tab at bedtime.  She wishes to continue this regimen for now but in the near future she questions if she can reduce the Famotidine.  She remains on Omeprazole 20 mg in the morning.  She denies having any nausea or vomiting.  No heartburn or dysphagia.  No significant burping at this time.  No upper or lower abdominal pain.  She is passing normal brown formed bowel movements most days.  No rectal bleeding.  Her weight has remained stable at 1 36-1 37 since her last office visit.  She reported weighing 145 pounds 8 to 9 months ago.  She is scheduled for her annual physical with her primary care physician in 3 months.  Her appetite is good.  Her energy level remains elevated and she is quite active.    CBC Latest Ref Rng & Units 12/29/2019 02/19/2018 09/08/2016  WBC 4.0 - 10.5 K/uL 9.3 6.3 13.3(H)  Hemoglobin 12.0 - 15.0 g/dL 13.3 13.5 11.6(L)  Hematocrit 36.0 - 46.0 % 40.0 41.5 35.0(L)  Platelets 150.0 - 400.0 K/uL 350.0 342 329     CMP Latest Ref Rng & Units 12/29/2019 02/19/2018 09/08/2016  Glucose 70 - 99 mg/dL 88 91 103(H)  BUN 6 - 23 mg/dL 16 11 11   Creatinine 0.40 - 1.20 mg/dL 0.70 0.69 0.63    Sodium 135 - 145 mEq/L 137 140 137  Potassium 3.5 - 5.1 mEq/L 4.0 3.7 3.5  Chloride 96 - 112 mEq/L 98 104 102  CO2 19 - 32 mEq/L 33(H) 29 27  Calcium 8.4 - 10.5 mg/dL 9.2 9.0 8.4(L)  Total Protein 6.0 - 8.3 g/dL 7.3 7.5 -  Total Bilirubin 0.2 - 1.2 mg/dL 0.3 0.4 -  Alkaline Phos 39 - 117 U/L 86 89 -  AST 0 - 37 U/L 12 17 -  ALT 0 - 35 U/L 6 12(L) -   Current Outpatient Medications on File Prior to Visit  Medication Sig Dispense Refill  . cholecalciferol (VITAMIN D3) 25 MCG (1000 UNIT) tablet Take 1,000 Units by mouth daily.    . famotidine (PEPCID) 20 MG tablet TAKE 1 TABLET BY MOUTH EVERYDAY AT BEDTIME 30 tablet 1  . fluticasone (FLONASE) 50 MCG/ACT nasal spray Place 2 sprays into both nostrils daily.  5  . latanoprost (XALATAN) 0.005 % ophthalmic solution Place 1 drop into both eyes at bedtime.     Marland Kitchen losartan-hydrochlorothiazide (HYZAAR) 50-12.5 MG per tablet Take 1 tablet by mouth daily. 30 tablet 0  . omeprazole (PRILOSEC) 20 MG capsule Take 20 mg by mouth daily.    . simethicone (GAS-X) 80 MG chewable tablet Chew 1 tablet by mouth at bedtime.    . sodium chloride (OCEAN) 0.65 % SOLN nasal  spray Place 1 spray into both nostrils as needed for congestion.    . timolol (TIMOPTIC) 0.5 % ophthalmic solution Place 1 drop into both eyes every morning.  12   No current facility-administered medications on file prior to visit.    Allergies  Allergen Reactions  . Neo-Synephrine [Oxymetazoline] Other (See Comments)    hyper  . Prednisone Other (See Comments)    hyper     Current Medications, Allergies, Past Medical History, Past Surgical History, Family History and Social History were reviewed in [employer] record.   Physical Exam: BP 126/64   Pulse 68   Ht 5' 3.5" (1.613 m)   Wt 136 lb 9.6 oz (62 kg)   BMI 23.82 kg/m  General: Well developed 86 year old female in no acute distress. Lungs: Clear throughout to auscultation. Heart: Regular rate and  rhythm, no murmur. Abdomen: Soft, nontender and nondistended. No masses or hepatomegaly. Normal bowel sounds x 4 quadrants.  Rectal: Deferred.  Musculoskeletal: Symmetrical with no gross deformities. Extremities: No edema. Neurological: Alert oriented x 4. No focal deficits.  Psychological: Alert and cooperative. Normal mood and affect  Assessment and Recommendations:  98. 86 year old female with burping/epigastric gas discomfort resolved on Famotidine 20mg  one tab at bed time and Gas X. -Continue Omeprazole 20mg  QAM, Famotidine 20mg  QHS and Gas X 1 tab po QHS -In 1 or 2 months, consider reducing Famotidine 20 mg 1 p.o. every other night if tolerated -Follow-up with PCP in 3 months for your annual physical and weight recheck -Follow-up in our office as needed

## 2020-02-12 ENCOUNTER — Other Ambulatory Visit: Payer: Self-pay | Admitting: Nurse Practitioner

## 2020-02-17 ENCOUNTER — Telehealth: Payer: Self-pay | Admitting: Nurse Practitioner

## 2020-02-17 NOTE — Telephone Encounter (Signed)
Dana Blake, pls have the patient take Gas X and Pepcid one tab of each bid (she was taking just at bed time). If she develops severe abdominal pain over the weekend then to the ER. Pt or daughter to contact me next week ( I am out of office on Monday) with an update.  Thx

## 2020-02-17 NOTE — Telephone Encounter (Signed)
Pt's daughter reported that pt is experiencing abdominal pain.  Please advise.

## 2020-02-17 NOTE — Telephone Encounter (Signed)
Spoke with the patient and her daughter on her phone. Instructions given . Teach back method. They expressed appreciation for the call.

## 2020-02-17 NOTE — Telephone Encounter (Signed)
[employer] with this very nice patient and her daughter on her blue tooth in her car. The patient states she has continued to take the Pepcid and the "other medicine" at night like you had instructed. She is careful on what she eats and when she eats. Past 10 nights have not been good. She is experiencing the same abdominal pain. She asks what would you suggest?

## 2020-03-15 ENCOUNTER — Other Ambulatory Visit: Payer: Self-pay | Admitting: Nurse Practitioner

## 2020-03-23 ENCOUNTER — Other Ambulatory Visit: Payer: Self-pay | Admitting: Nurse Practitioner

## 2020-03-29 ENCOUNTER — Telehealth: Payer: Self-pay | Admitting: Nurse Practitioner

## 2020-03-29 NOTE — Telephone Encounter (Signed)
Pt is wanting to inform the nurse the medication Pepcid is working wonderfully for her.

## 2020-04-02 NOTE — Telephone Encounter (Signed)
Spoke with the patient. She is doing well on BID Pepcid OTC.  She is pleased and wanted to let you know her symptoms are gone.

## 2020-06-11 ENCOUNTER — Other Ambulatory Visit: Payer: Self-pay | Admitting: Family Medicine

## 2020-06-11 DIAGNOSIS — Z0000 Encounter for general adult medical examination without abnormal findings: Secondary | ICD-10-CM

## 2020-06-15 ENCOUNTER — Ambulatory Visit: Payer: Medicare Other

## 2020-06-25 ENCOUNTER — Ambulatory Visit: Payer: Medicare Other | Attending: Internal Medicine

## 2020-06-25 DIAGNOSIS — Z23 Encounter for immunization: Secondary | ICD-10-CM

## 2020-06-25 NOTE — Progress Notes (Signed)
   Covid-19 Vaccination Clinic  Name:  Dana Blake    MRN: [id number] DOB: [birth year]  06/25/2020  Dana Blake was observed post Covid-19 immunization for 15 minutes without incident. She was provided with Vaccine Information Sheet and instruction to access the V-Safe system.   Dana Blake was instructed to call 911 with any severe reactions post vaccine: Marland Kitchen Difficulty breathing  . Swelling of face and throat  . A fast heartbeat  . A bad rash all over body  . Dizziness and weakness

## 2020-06-29 ENCOUNTER — Ambulatory Visit: Payer: Medicare Other

## 2020-08-13 ENCOUNTER — Other Ambulatory Visit: Payer: Self-pay

## 2020-08-13 ENCOUNTER — Ambulatory Visit
Admission: RE | Admit: 2020-08-13 | Discharge: 2020-08-13 | Disposition: A | Discharge status: Home / Self Care | Payer: Medicare Other | Source: Ambulatory Visit | Attending: Family Medicine | Admitting: Family Medicine

## 2020-08-13 DIAGNOSIS — Z0000 Encounter for general adult medical examination without abnormal findings: Secondary | ICD-10-CM

## 2020-12-20 DIAGNOSIS — S81801A Unspecified open wound, right lower leg, initial encounter: Secondary | ICD-10-CM | POA: Diagnosis not present

## 2020-12-20 DIAGNOSIS — R3915 Urgency of urination: Secondary | ICD-10-CM | POA: Diagnosis not present

## 2020-12-20 DIAGNOSIS — N390 Urinary tract infection, site not specified: Secondary | ICD-10-CM | POA: Diagnosis not present

## 2020-12-28 DIAGNOSIS — N390 Urinary tract infection, site not specified: Secondary | ICD-10-CM | POA: Diagnosis not present

## 2021-01-03 DIAGNOSIS — Z0130 Encounter for examination of blood pressure without abnormal findings: Secondary | ICD-10-CM | POA: Diagnosis not present

## 2021-01-03 DIAGNOSIS — K5900 Constipation, unspecified: Secondary | ICD-10-CM | POA: Diagnosis not present

## 2021-01-24 ENCOUNTER — Ambulatory Visit: Payer: Medicare Other

## 2021-01-24 ENCOUNTER — Other Ambulatory Visit: Payer: Self-pay

## 2021-01-24 ENCOUNTER — Ambulatory Visit: Payer: Medicare Other | Attending: Internal Medicine

## 2021-01-24 ENCOUNTER — Other Ambulatory Visit (HOSPITAL_BASED_OUTPATIENT_CLINIC_OR_DEPARTMENT_OTHER): Payer: Self-pay

## 2021-01-24 DIAGNOSIS — Z23 Encounter for immunization: Secondary | ICD-10-CM

## 2021-01-24 MED ORDER — PFIZER-BIONT COVID-19 VAC-TRIS 30 MCG/0.3ML IM SUSP
INTRAMUSCULAR | 0 refills | Status: DC
  Filled 2021-01-24: qty 0.3, 1d supply, fill #0

## 2021-01-24 NOTE — Progress Notes (Signed)
   Covid-19 Vaccination Clinic  Name:  Dana Blake    MRN: [id number] DOB: 10-16-1926  01/24/2021  Dana Blake was observed post Covid-19 immunization for 15 minutes without incident. She was provided with Vaccine Information Sheet and instruction to access the V-Safe system.   Dana Blake was instructed to call 911 with any severe reactions post vaccine: Marland Kitchen Difficulty breathing  . Swelling of face and throat  . A fast heartbeat  . A bad rash all over body  . Dizziness and weakness   Immunizations Administered    Name Date Dose VIS Date Route   PFIZER Comrnaty(Gray TOP) Covid-19 Vaccine 01/24/2021 10:56 AM 0.3 mL 08/16/2020 Intramuscular   Manufacturer: [employer], [employer]   Lot: [id number]   [place]: [phone]

## 2021-02-01 DIAGNOSIS — S60212A Contusion of left wrist, initial encounter: Secondary | ICD-10-CM | POA: Diagnosis not present

## 2021-03-29 DIAGNOSIS — M545 Low back pain, unspecified: Secondary | ICD-10-CM | POA: Diagnosis not present

## 2021-04-15 DIAGNOSIS — Z0000 Encounter for general adult medical examination without abnormal findings: Secondary | ICD-10-CM | POA: Diagnosis not present

## 2021-05-29 DIAGNOSIS — Z961 Presence of intraocular lens: Secondary | ICD-10-CM | POA: Diagnosis not present

## 2021-05-29 DIAGNOSIS — H04123 Dry eye syndrome of bilateral lacrimal glands: Secondary | ICD-10-CM | POA: Diagnosis not present

## 2021-05-29 DIAGNOSIS — H401134 Primary open-angle glaucoma, bilateral, indeterminate stage: Secondary | ICD-10-CM | POA: Diagnosis not present

## 2021-05-29 DIAGNOSIS — H353134 Nonexudative age-related macular degeneration, bilateral, advanced atrophic with subfoveal involvement: Secondary | ICD-10-CM | POA: Diagnosis not present

## 2021-05-29 DIAGNOSIS — H4321 Crystalline deposits in vitreous body, right eye: Secondary | ICD-10-CM | POA: Diagnosis not present

## 2021-06-18 DIAGNOSIS — I10 Essential (primary) hypertension: Secondary | ICD-10-CM | POA: Diagnosis not present

## 2021-06-18 DIAGNOSIS — E782 Mixed hyperlipidemia: Secondary | ICD-10-CM | POA: Diagnosis not present

## 2021-06-18 DIAGNOSIS — E559 Vitamin D deficiency, unspecified: Secondary | ICD-10-CM | POA: Diagnosis not present

## 2021-06-18 DIAGNOSIS — Z23 Encounter for immunization: Secondary | ICD-10-CM | POA: Diagnosis not present

## 2021-07-11 ENCOUNTER — Other Ambulatory Visit: Payer: Self-pay | Admitting: Obstetrics and Gynecology

## 2021-08-14 DIAGNOSIS — H53413 Scotoma involving central area, bilateral: Secondary | ICD-10-CM | POA: Diagnosis not present

## 2021-08-21 DIAGNOSIS — N6459 Other signs and symptoms in breast: Secondary | ICD-10-CM | POA: Diagnosis not present

## 2021-08-28 ENCOUNTER — Other Ambulatory Visit: Payer: Self-pay | Admitting: Obstetrics and Gynecology

## 2021-08-28 DIAGNOSIS — H53413 Scotoma involving central area, bilateral: Secondary | ICD-10-CM | POA: Diagnosis not present

## 2021-08-28 DIAGNOSIS — N644 Mastodynia: Secondary | ICD-10-CM

## 2021-10-08 ENCOUNTER — Ambulatory Visit: Admission: RE | Admit: 2021-10-08 | Payer: Medicare Other | Source: Ambulatory Visit

## 2021-10-08 ENCOUNTER — Ambulatory Visit
Admission: RE | Admit: 2021-10-08 | Discharge: 2021-10-08 | Disposition: A | Discharge status: Home / Self Care | Payer: Medicare Other | Source: Ambulatory Visit | Attending: Obstetrics and Gynecology | Admitting: Obstetrics and Gynecology

## 2021-10-08 DIAGNOSIS — R922 Inconclusive mammogram: Secondary | ICD-10-CM | POA: Diagnosis not present

## 2021-10-08 DIAGNOSIS — N644 Mastodynia: Secondary | ICD-10-CM

## 2021-10-16 ENCOUNTER — Ambulatory Visit: Payer: Medicare Other

## 2021-10-18 ENCOUNTER — Other Ambulatory Visit: Payer: Self-pay

## 2021-10-18 ENCOUNTER — Ambulatory Visit: Payer: Medicare Other | Attending: Internal Medicine

## 2021-10-18 ENCOUNTER — Other Ambulatory Visit (HOSPITAL_BASED_OUTPATIENT_CLINIC_OR_DEPARTMENT_OTHER): Payer: Self-pay

## 2021-10-18 DIAGNOSIS — Z23 Encounter for immunization: Secondary | ICD-10-CM

## 2021-10-18 MED ORDER — PFIZER COVID-19 VAC BIVALENT 30 MCG/0.3ML IM SUSP
INTRAMUSCULAR | 0 refills | Status: DC
  Filled 2021-10-18: qty 0.3, 1d supply, fill #0

## 2021-10-18 NOTE — Progress Notes (Signed)
° °  Covid-19 Vaccination Clinic  Name:  Dana Blake    MRN: [id number] DOB: [birth year]  10/18/2021  Dana Blake was observed post Covid-19 immunization for 15 minutes without incident. She was provided with Vaccine Information Sheet and instruction to access the V-Safe system.   Dana Blake was instructed to call 911 with any severe reactions post vaccine: Difficulty breathing  Swelling of face and throat  A fast heartbeat  A bad rash all over body  Dizziness and weakness   Immunizations Administered     Name Date Dose VIS Date Route   Pfizer Covid-19 Vaccine Bivalent Booster 10/18/2021 10:46 AM 0.3 mL 05/08/2021 Intramuscular   Manufacturer: [employer]   Lot: [id number]   [place]: [phone]

## 2021-11-29 DIAGNOSIS — L989 Disorder of the skin and subcutaneous tissue, unspecified: Secondary | ICD-10-CM | POA: Diagnosis not present

## 2021-12-17 DIAGNOSIS — H6122 Impacted cerumen, left ear: Secondary | ICD-10-CM | POA: Diagnosis not present

## 2021-12-26 DIAGNOSIS — H938X2 Other specified disorders of left ear: Secondary | ICD-10-CM | POA: Diagnosis not present

## 2021-12-26 DIAGNOSIS — H90A32 Mixed conductive and sensorineural hearing loss, unilateral, left ear with restricted hearing on the contralateral side: Secondary | ICD-10-CM | POA: Diagnosis not present

## 2022-01-04 ENCOUNTER — Emergency Department (HOSPITAL_BASED_OUTPATIENT_CLINIC_OR_DEPARTMENT_OTHER): Payer: Medicare Other

## 2022-01-04 ENCOUNTER — Emergency Department (HOSPITAL_BASED_OUTPATIENT_CLINIC_OR_DEPARTMENT_OTHER)
Admission: EM | Admit: 2022-01-04 | Discharge: 2022-01-04 | Disposition: A | Discharge status: Home / Self Care | Payer: Medicare Other | Attending: Emergency Medicine | Admitting: Emergency Medicine

## 2022-01-04 ENCOUNTER — Encounter (HOSPITAL_BASED_OUTPATIENT_CLINIC_OR_DEPARTMENT_OTHER): Payer: Self-pay | Admitting: Emergency Medicine

## 2022-01-04 ENCOUNTER — Other Ambulatory Visit: Payer: Self-pay

## 2022-01-04 DIAGNOSIS — M546 Pain in thoracic spine: Secondary | ICD-10-CM | POA: Diagnosis not present

## 2022-01-04 DIAGNOSIS — Z79899 Other long term (current) drug therapy: Secondary | ICD-10-CM | POA: Diagnosis not present

## 2022-01-04 DIAGNOSIS — R0781 Pleurodynia: Secondary | ICD-10-CM | POA: Diagnosis not present

## 2022-01-04 DIAGNOSIS — M549 Dorsalgia, unspecified: Secondary | ICD-10-CM

## 2022-01-04 LAB — URINALYSIS, ROUTINE W REFLEX MICROSCOPIC
Bilirubin Urine: NEGATIVE
Glucose, UA: NEGATIVE mg/dL
Ketones, ur: NEGATIVE mg/dL
Leukocytes,Ua: NEGATIVE
Nitrite: NEGATIVE
Protein, ur: NEGATIVE mg/dL
Specific Gravity, Urine: 1.006 (ref 1.005–1.030)
pH: 5 (ref 5.0–8.0)

## 2022-01-04 MED ORDER — ACETAMINOPHEN 325 MG PO TABS
650.0000 mg | ORAL_TABLET | Freq: Once | ORAL | Status: AC
  Administered 2022-01-04: 650 mg via ORAL
  Filled 2022-01-04: qty 2

## 2022-01-04 NOTE — ED Triage Notes (Addendum)
Pt c/o pain to mid back x 1 week.  Pt denies injury, pt recently moved into an assisted living facility. No rash noted to area. ?

## 2022-01-04 NOTE — Discharge Instructions (Addendum)
Your x-ray and urine test were normal.  No fracture noted. ? ?Continue taking extra strength Tylenol up to 3 times a day as needed for back pain. ? ?Return immediately back to the ER if you have worsening pain, new numbness weakness, or any additional concerns. ? ?Otherwise follow-up with your primary care doctor within the week. ? ?

## 2022-01-04 NOTE — ED Provider Notes (Signed)
?[facility]PT ?Provider Note ? ? ?CSN: [id number] ?Arrival date & time: 01/04/22  1210 ? ?  ? ?History ? ?Chief Complaint  ?Patient presents with  ? Back Pain  ? ? ?Dana Blake is a 88 y.o. female. ? ?Patient presents chief complaint of back pain.  Describes as more upper bilateral rib pain.  Worse when she turns her torso a certain way or moves her body a certain way.  Symptoms ongoing for about a week.  She recently moved into assisted living facility.  Otherwise denies any fall or trauma.  No vomiting no cough no diarrhea.  At rest the pain is minimal, however it is exacerbated when she moves in certain ways. ? ? ?  ? ?Home Medications ?Prior to Admission medications   ?Medication Sig Start Date End Date Taking? Authorizing Provider  ?cholecalciferol (VITAMIN D3) 25 MCG (1000 UNIT) tablet Take 1,000 Units by mouth daily.    [provider]  ?COVID-19 mRNA bivalent vaccine, Pfizer, (PFIZER COVID-19 VAC BIVALENT) injection Inject into the muscle. 10/18/21   Judyann Munson, MD  ?COVID-19 mRNA Vac-TriS, Pfizer, (PFIZER-BIONT COVID-19 VAC-TRIS) SUSP injection Inject into the muscle. 01/24/21   Judyann Munson, MD  ?famotidine (PEPCID) 20 MG tablet TAKE 1 TABLET BY MOUTH EVERYDAY AT BEDTIME 03/24/20   Arnaldo Natal, NP  ?fluticasone (FLONASE) 50 MCG/ACT nasal spray Place 2 sprays into both nostrils daily. 12/30/17   [provider]  ?latanoprost (XALATAN) 0.005 % ophthalmic solution Place 1 drop into both eyes at bedtime.     [provider]  ?losartan-hydrochlorothiazide (HYZAAR) 50-12.5 MG per tablet Take 1 tablet by mouth daily. 10/24/14   Nyoka Cowden, MD  ?omeprazole (PRILOSEC) 20 MG capsule Take 20 mg by mouth daily.    [provider]  ?simethicone (GAS-X) 80 MG chewable tablet Chew 1 tablet by mouth at bedtime.    [provider]  ?sodium chloride (OCEAN) 0.65 % SOLN nasal spray Place 1 spray into both nostrils as needed for  congestion.    [provider]  ?timolol (TIMOPTIC) 0.5 % ophthalmic solution Place 1 drop into both eyes every morning. 07/26/15   [provider]  ?   ? ?Allergies    ?Neo-synephrine [oxymetazoline] and Prednisone   ? ?Review of Systems   ?Review of Systems  ?Constitutional:  Negative for fever.  ?HENT:  Negative for ear pain.   ?Eyes:  Negative for pain.  ?Respiratory:  Negative for cough.   ?Cardiovascular:  Negative for chest pain.  ?Gastrointestinal:  Negative for abdominal pain.  ?Genitourinary:  Negative for flank pain.  ?Musculoskeletal:  Positive for back pain.  ?Skin:  Negative for rash.  ?Neurological:  Negative for headaches.  ? ?Physical Exam ?Updated Vital Signs ?BP (!) 148/69   Pulse 65   Temp 99.6 ?F (37.6 ?C)   Resp 16   Ht 5\' 4"  (1.626 m)   Wt 54.4 kg   SpO2 98%   BMI 20.60 kg/m?  ?Physical Exam ?Constitutional:   ?   General: She is not in acute distress. ?   Appearance: Normal appearance.  ?HENT:  ?   Head: Normocephalic.  ?   Nose: Nose normal.  ?Eyes:  ?   Extraocular Movements: Extraocular movements intact.  ?Cardiovascular:  ?   Rate and Rhythm: Normal rate.  ?Pulmonary:  ?   Effort: Pulmonary effort is normal.  ?Musculoskeletal:     ?   General: Normal range of motion.  ?  Cervical back: Normal range of motion.  ?   Comments: Patient able to transfer from sitting to standing and ambulation without significant discomfort on exam.  She is amatory without assistance. ? ?No C or T or L-spine midline step-offs or tenderness noted. ? ?She does have some mild bilateral paraspinal tenderness on exam.  When she twists her torso to the right it reproduces her right-sided back pain.  Otherwise no focal neurodeficit noted.  Ranging her shoulders elbows wrists and hips knees and ankles well without pain.  ?Neurological:  ?   General: No focal deficit present.  ?   Mental Status: She is alert. Mental status is at baseline.  ? ? ?ED Results / Procedures / Treatments   ?Labs ?(all  labs ordered are listed, but only abnormal results are displayed) ?Labs Reviewed  ?URINALYSIS, ROUTINE W REFLEX MICROSCOPIC - Abnormal; Notable for the following components:  ?    Result Value  ? Color, Urine COLORLESS (*)   ? Hgb urine dipstick TRACE (*)   ? Bacteria, UA RARE (*)   ? All other components within normal limits  ? ? ?EKG ?None ? ?Radiology ?DG Chest Portable 1 View ? ?Result Date: 01/04/2022 ?CLINICAL DATA:  Bilateral rib pain. Pain to midback for 1 week. No known injury. EXAM: PORTABLE CHEST 1 VIEW COMPARISON:  10/06/2016 FINDINGS: Heart size and mediastinal contours are stable. Aortic atherosclerosis. Diffuse, reticular interstitial opacities within both lungs are again noted likely reflecting underlying chronic lung disease. No superimposed pleural effusion, interstitial edema, or airspace opacities. The visualized osseous structures appear intact. IMPRESSION: 1. No acute cardiopulmonary abnormalities. 2. Chronic lung disease. 3.  Aortic Atherosclerosis (ICD10-I70.0). Electronically Signed   By: Signa Kell M.D.   On: 01/04/2022 14:39   ? ?Procedures ?Procedures  ? ? ?Medications Ordered in ED ?Medications  ?acetaminophen (TYLENOL) tablet 650 mg (650 mg Oral Given 01/04/22 1413)  ? ? ?ED Course/ Medical Decision Making/ A&P ?  ?                        ?Medical Decision Making ?Amount and/or Complexity of Data Reviewed ?Labs: ordered. ?Radiology: ordered. ? ?Risk ?OTC drugs. ? ? ?History also augmented by daughter-in-law at bedside. ? ?Chart review shows outpatient visit December 26, 2021 for ENT visit. ? ?Given exam today I doubt significant serious internal injury.  Vital signs within normal limits patient appears relatively comfortable given Tylenol for symptom management.  I doubt aortic etiology or cauda equina. ? ?X-rays of the chest unremarkable urinalysis shows no evidence of infection.  Recommend he continue rest and Tylenol as needed.  Advised outpatient follow-up with her doctor within the  week.  Advising immediate return for fevers worsening symptoms or any additional concerns. ? ? ? ? ? ? ? ?Final Clinical Impression(s) / ED Diagnoses ?Final diagnoses:  ?Upper back pain  ? ? ?Rx / DC Orders ?ED Discharge Orders   ? ? None  ? ?  ? ? ?  ?Cheryll Cockayne, MD ?01/04/22 1510 ? ?

## 2022-01-09 DIAGNOSIS — M546 Pain in thoracic spine: Secondary | ICD-10-CM | POA: Diagnosis not present

## 2022-02-04 DIAGNOSIS — R488 Other symbolic dysfunctions: Secondary | ICD-10-CM | POA: Diagnosis not present

## 2022-02-04 DIAGNOSIS — M6281 Muscle weakness (generalized): Secondary | ICD-10-CM | POA: Diagnosis not present

## 2022-02-05 DIAGNOSIS — M6281 Muscle weakness (generalized): Secondary | ICD-10-CM | POA: Diagnosis not present

## 2022-02-07 DIAGNOSIS — M6281 Muscle weakness (generalized): Secondary | ICD-10-CM | POA: Diagnosis not present

## 2022-02-09 IMAGING — MG DIGITAL DIAGNOSTIC BILAT W/ TOMO W/ CAD
6 of 12 series · 6 of 36 positions shown · non-contrast
Comparison: Previous exam(s).

CLINICAL DATA: Patient describes recent RIGHT-sided breast pain,
now resolved.

EXAM:
DIGITAL DIAGNOSTIC BILATERAL MAMMOGRAM WITH TOMOSYNTHESIS AND CAD
TECHNIQUE: Bilateral digital diagnostic mammography and breast tomosynthesis
was performed. The images were evaluated with computer-aided
detection.

[L MLO synth-2D (1 of 2)]
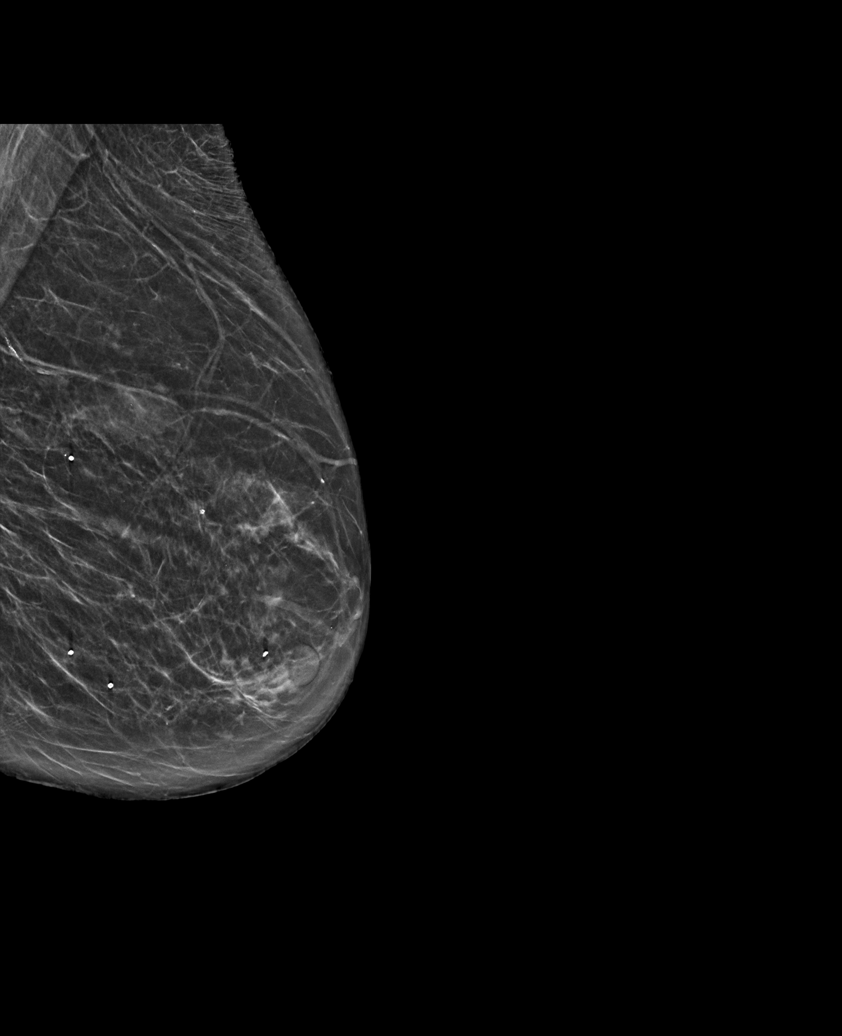

[L MLO synth-2D (2 of 2)]
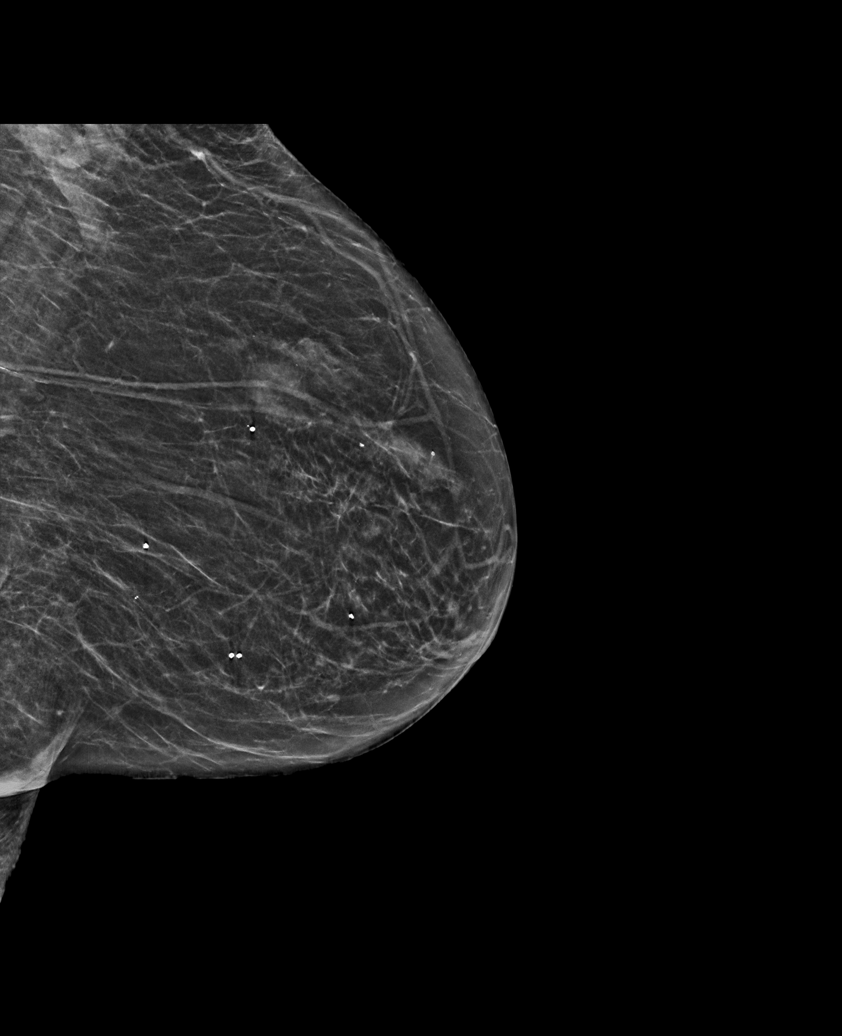

[L CC synth-2D]
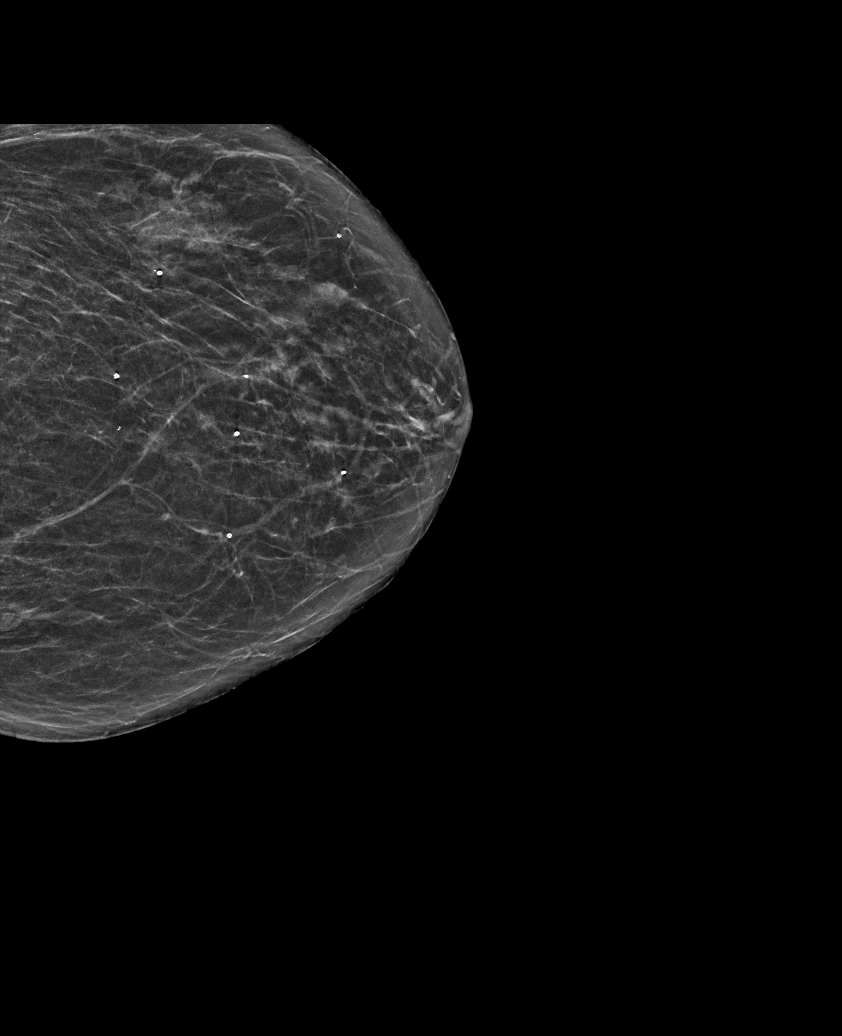

[R MLO synth-2D (1 of 2)]
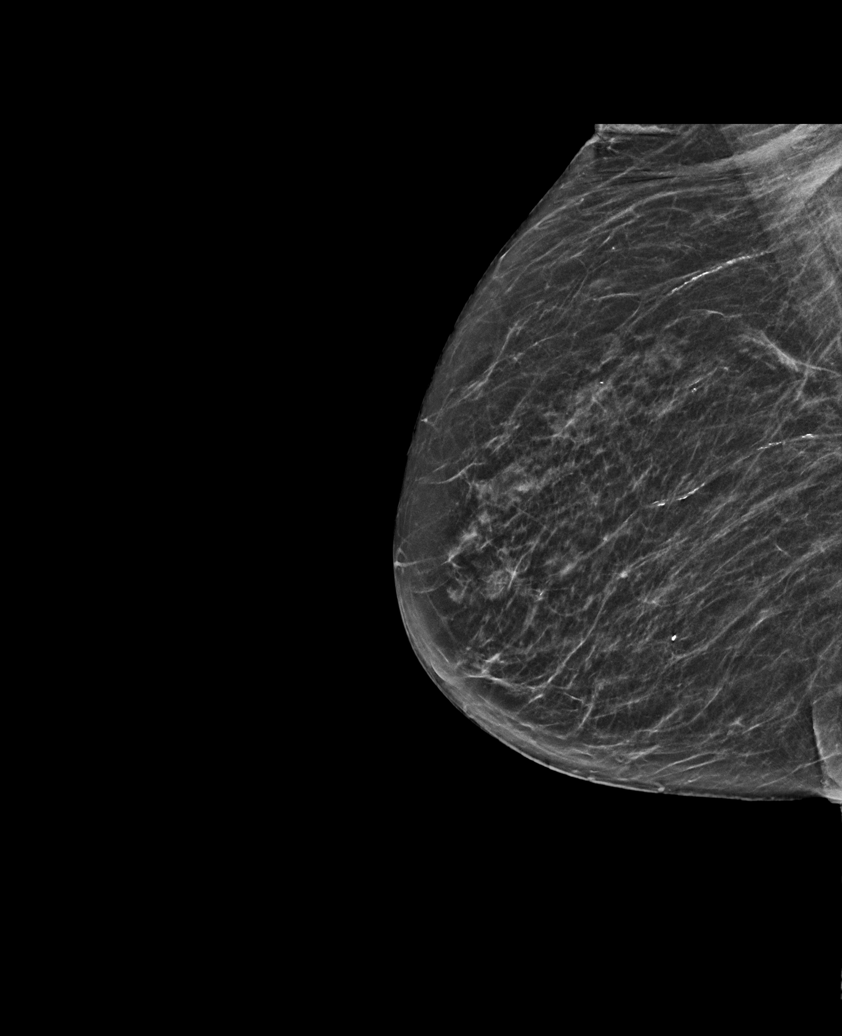

[R CC synth-2D]
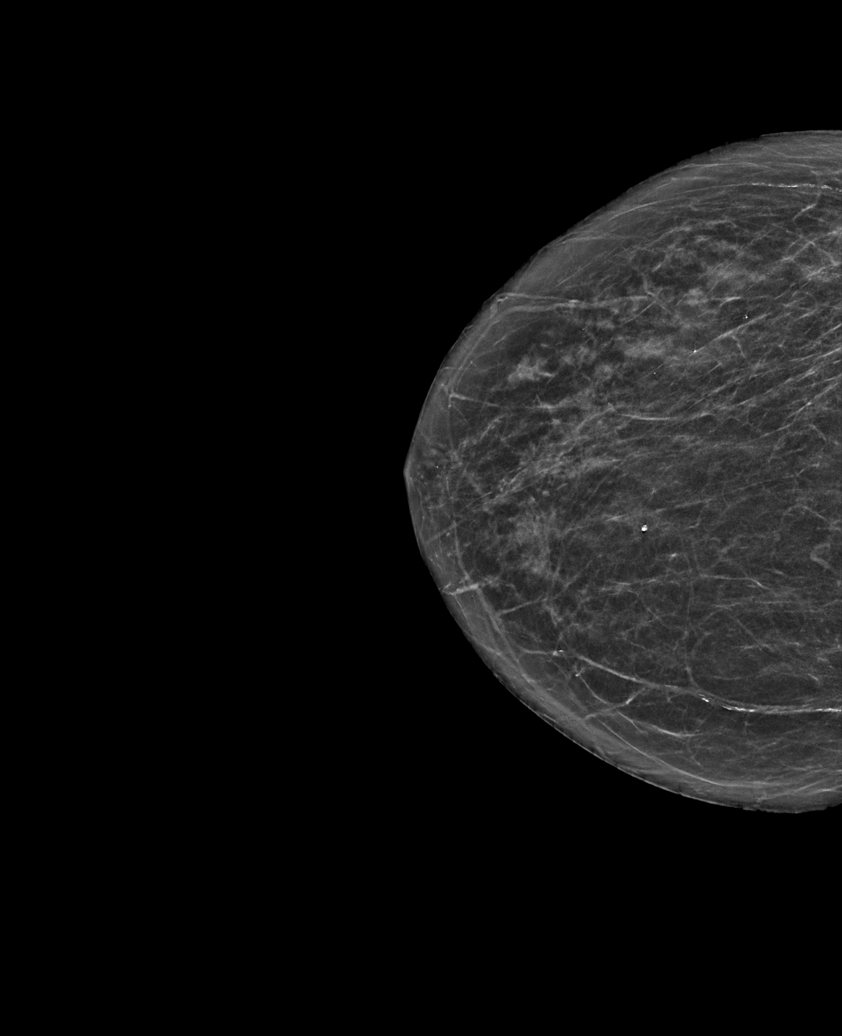

[R MLO synth-2D (2 of 2)]
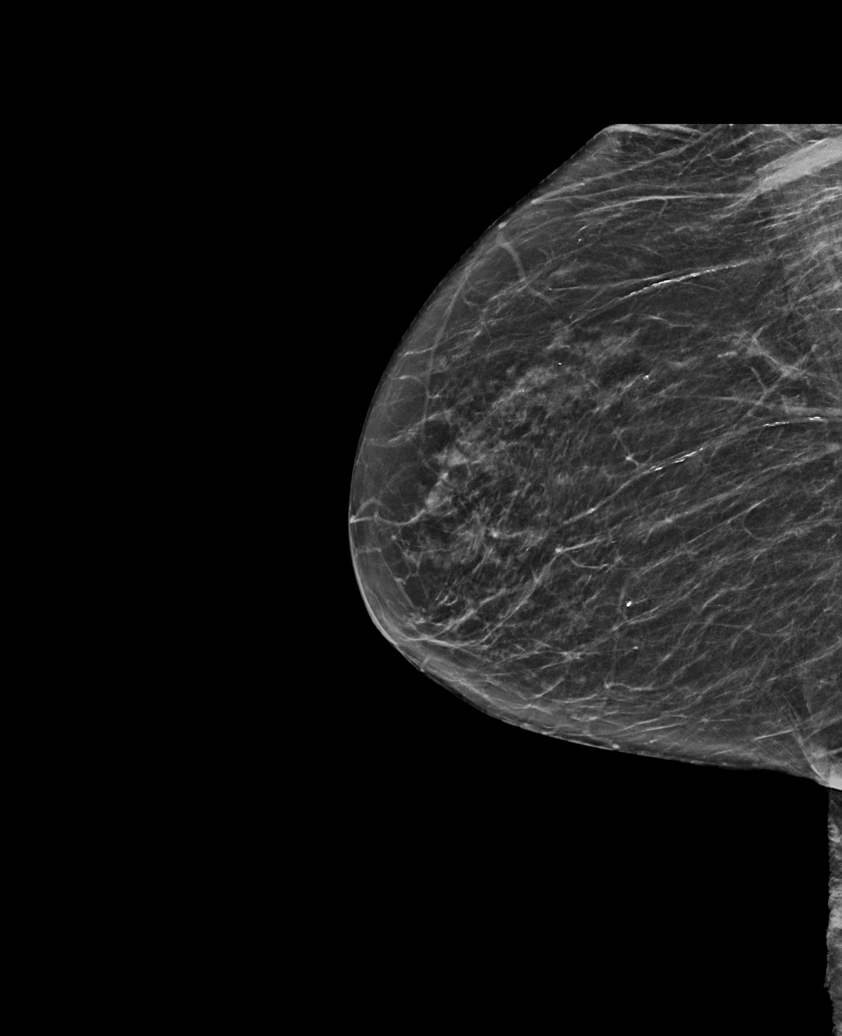

[6 of 36 positions shown; findings below may reference images not displayed]

ACR Breast Density Category b: There are scattered areas of
fibroglandular density.
FINDINGS: There are no new dominant masses, suspicious calcifications or
secondary signs of malignancy within either breast. Specifically,
there is no mammographic abnormality within the RIGHT breast,
corresponding to the area of clinical concern
IMPRESSION: No evidence of malignancy within either breast.

RECOMMENDATION:
Screening mammogram in one year.(Code:SG-[id number])

I have discussed the findings and recommendations with the patient
and her daughter. If applicable, a reminder letter will be sent to
the patient regarding the next appointment.

BI-RADS CATEGORY  1: Negative.

## 2022-02-18 DIAGNOSIS — R488 Other symbolic dysfunctions: Secondary | ICD-10-CM | POA: Diagnosis not present

## 2022-02-20 DIAGNOSIS — M6281 Muscle weakness (generalized): Secondary | ICD-10-CM | POA: Diagnosis not present

## 2022-02-21 DIAGNOSIS — I10 Essential (primary) hypertension: Secondary | ICD-10-CM | POA: Diagnosis not present

## 2022-02-21 DIAGNOSIS — R5381 Other malaise: Secondary | ICD-10-CM | POA: Diagnosis not present

## 2022-02-21 DIAGNOSIS — R488 Other symbolic dysfunctions: Secondary | ICD-10-CM | POA: Diagnosis not present

## 2022-02-21 DIAGNOSIS — M6281 Muscle weakness (generalized): Secondary | ICD-10-CM | POA: Diagnosis not present

## 2022-02-21 DIAGNOSIS — R413 Other amnesia: Secondary | ICD-10-CM | POA: Diagnosis not present

## 2022-02-21 DIAGNOSIS — R42 Dizziness and giddiness: Secondary | ICD-10-CM | POA: Diagnosis not present

## 2022-02-21 DIAGNOSIS — E559 Vitamin D deficiency, unspecified: Secondary | ICD-10-CM | POA: Diagnosis not present

## 2022-02-24 DIAGNOSIS — M6281 Muscle weakness (generalized): Secondary | ICD-10-CM | POA: Diagnosis not present

## 2022-02-25 DIAGNOSIS — R488 Other symbolic dysfunctions: Secondary | ICD-10-CM | POA: Diagnosis not present

## 2022-02-25 DIAGNOSIS — M6281 Muscle weakness (generalized): Secondary | ICD-10-CM | POA: Diagnosis not present

## 2022-02-26 DIAGNOSIS — I10 Essential (primary) hypertension: Secondary | ICD-10-CM | POA: Diagnosis not present

## 2022-02-26 DIAGNOSIS — E538 Deficiency of other specified B group vitamins: Secondary | ICD-10-CM | POA: Diagnosis not present

## 2022-02-27 DIAGNOSIS — M6281 Muscle weakness (generalized): Secondary | ICD-10-CM | POA: Diagnosis not present

## 2022-02-28 DIAGNOSIS — R488 Other symbolic dysfunctions: Secondary | ICD-10-CM | POA: Diagnosis not present

## 2022-02-28 DIAGNOSIS — E538 Deficiency of other specified B group vitamins: Secondary | ICD-10-CM | POA: Diagnosis not present

## 2022-03-04 DIAGNOSIS — R488 Other symbolic dysfunctions: Secondary | ICD-10-CM | POA: Diagnosis not present

## 2022-03-04 DIAGNOSIS — M6281 Muscle weakness (generalized): Secondary | ICD-10-CM | POA: Diagnosis not present

## 2022-03-05 DIAGNOSIS — M6281 Muscle weakness (generalized): Secondary | ICD-10-CM | POA: Diagnosis not present

## 2022-03-06 DIAGNOSIS — M6281 Muscle weakness (generalized): Secondary | ICD-10-CM | POA: Diagnosis not present

## 2022-03-06 DIAGNOSIS — R488 Other symbolic dysfunctions: Secondary | ICD-10-CM | POA: Diagnosis not present

## 2022-03-07 DIAGNOSIS — M6281 Muscle weakness (generalized): Secondary | ICD-10-CM | POA: Diagnosis not present

## 2022-03-12 DIAGNOSIS — M6281 Muscle weakness (generalized): Secondary | ICD-10-CM | POA: Diagnosis not present

## 2022-03-13 DIAGNOSIS — R488 Other symbolic dysfunctions: Secondary | ICD-10-CM | POA: Diagnosis not present

## 2022-03-13 DIAGNOSIS — M6281 Muscle weakness (generalized): Secondary | ICD-10-CM | POA: Diagnosis not present

## 2022-03-14 DIAGNOSIS — M6281 Muscle weakness (generalized): Secondary | ICD-10-CM | POA: Diagnosis not present

## 2022-03-16 ENCOUNTER — Other Ambulatory Visit: Payer: Self-pay

## 2022-03-16 ENCOUNTER — Encounter (HOSPITAL_BASED_OUTPATIENT_CLINIC_OR_DEPARTMENT_OTHER): Payer: Self-pay

## 2022-03-16 ENCOUNTER — Emergency Department (HOSPITAL_BASED_OUTPATIENT_CLINIC_OR_DEPARTMENT_OTHER): Payer: Medicare Other

## 2022-03-16 ENCOUNTER — Emergency Department (HOSPITAL_BASED_OUTPATIENT_CLINIC_OR_DEPARTMENT_OTHER)
Admission: EM | Admit: 2022-03-16 | Discharge: 2022-03-16 | Disposition: A | Discharge status: Home / Self Care | Payer: Medicare Other | Attending: Emergency Medicine | Admitting: Emergency Medicine

## 2022-03-16 DIAGNOSIS — R079 Chest pain, unspecified: Secondary | ICD-10-CM | POA: Diagnosis not present

## 2022-03-16 DIAGNOSIS — R0789 Other chest pain: Secondary | ICD-10-CM | POA: Diagnosis not present

## 2022-03-16 DIAGNOSIS — Z20822 Contact with and (suspected) exposure to covid-19: Secondary | ICD-10-CM | POA: Diagnosis not present

## 2022-03-16 DIAGNOSIS — R42 Dizziness and giddiness: Secondary | ICD-10-CM | POA: Insufficient documentation

## 2022-03-16 DIAGNOSIS — J069 Acute upper respiratory infection, unspecified: Secondary | ICD-10-CM | POA: Diagnosis not present

## 2022-03-16 LAB — RESP PANEL BY RT-PCR (FLU A&B, COVID) ARPGX2
Influenza A by PCR: NEGATIVE
Influenza B by PCR: NEGATIVE
SARS Coronavirus 2 by RT PCR: NEGATIVE

## 2022-03-16 LAB — BASIC METABOLIC PANEL
Anion gap: 8 (ref 5–15)
BUN: 10 mg/dL (ref 8–23)
CO2: 28 mmol/L (ref 22–32)
Calcium: 9.4 mg/dL (ref 8.9–10.3)
Chloride: 102 mmol/L (ref 98–111)
Creatinine, Ser: 0.66 mg/dL (ref 0.44–1.00)
GFR, Estimated: >60 mL/min (ref >60)
Glucose, Bld: 84 mg/dL (ref 70–99)
Potassium: 3.9 mmol/L (ref 3.5–5.1)
Sodium: 138 mmol/L (ref 135–145)

## 2022-03-16 LAB — CBC
HCT: 39.3 % (ref 36.0–46.0)
Hemoglobin: 12.4 g/dL (ref 12.0–15.0)
MCH: 27.7 pg (ref 26.0–34.0)
MCHC: 31.6 g/dL (ref 30.0–36.0)
MCV: 87.9 fL (ref 80.0–100.0)
Platelets: 315 10*3/uL (ref 150–400)
RBC: 4.47 MIL/uL (ref 3.87–5.11)
RDW: 14.1 % (ref 11.5–15.5)
WBC: 5.8 10*3/uL (ref 4.0–10.5)
nRBC: 0 % (ref 0.0–0.2)

## 2022-03-16 LAB — TROPONIN I (HIGH SENSITIVITY): Troponin I (High Sensitivity): 4 ng/L (ref <18)

## 2022-03-16 NOTE — ED Provider Notes (Signed)
[facility] EMERGENCY DEPT Provider Note   CSN: [id number] Arrival date & time: 03/16/22  1808     History  Chief Complaint  Patient presents with   Chest Pain    Dana Blake is a 88 y.o. female.   Chest Pain Patient presents with chest pain.  A cough for the last couple days.  Dry.  Occasionally feels a little lightheaded.  Recently moved to an assisted living and has been doing more physical activity because of it.  Not having exertional pain.  Likely sick contacts but none directly known.  No swelling in her legs.  No fevers.     Home Medications Prior to Admission medications   Medication Sig Start Date End Date Taking? Authorizing Provider  cholecalciferol (VITAMIN D3) 25 MCG (1000 UNIT) tablet Take 1,000 Units by mouth daily.    [provider]  COVID-19 mRNA bivalent vaccine, Pfizer, (PFIZER COVID-19 VAC BIVALENT) injection Inject into the muscle. 10/18/21   Judyann Munson, MD  COVID-19 mRNA Vac-TriS, Pfizer, (PFIZER-BIONT COVID-19 VAC-TRIS) SUSP injection Inject into the muscle. 01/24/21   Judyann Munson, MD  famotidine (PEPCID) 20 MG tablet TAKE 1 TABLET BY MOUTH EVERYDAY AT BEDTIME 03/24/20   Arnaldo Natal, NP  fluticasone (FLONASE) 50 MCG/ACT nasal spray Place 2 sprays into both nostrils daily. 12/30/17   [provider]  latanoprost (XALATAN) 0.005 % ophthalmic solution Place 1 drop into both eyes at bedtime.     [provider]  losartan-hydrochlorothiazide (HYZAAR) 50-12.5 MG per tablet Take 1 tablet by mouth daily. 10/24/14   Nyoka Cowden, MD  omeprazole (PRILOSEC) 20 MG capsule Take 20 mg by mouth daily.    [provider]  simethicone (GAS-X) 80 MG chewable tablet Chew 1 tablet by mouth at bedtime.    [provider]  sodium chloride (OCEAN) 0.65 % SOLN nasal spray Place 1 spray into both nostrils as needed for congestion.    [provider]  timolol (TIMOPTIC) 0.5 % ophthalmic  solution Place 1 drop into both eyes every morning. 07/26/15   [provider]      Allergies    Neo-synephrine [oxymetazoline] and Prednisone    Review of Systems   Review of Systems  Cardiovascular:  Positive for chest pain.    Physical Exam Updated Vital Signs BP (!) 159/68 (BP Location: Left Arm)   Pulse 69   Temp 97.7 F (36.5 C) (Oral)   Resp 15   Ht 5\' 4"  (1.626 m)   Wt 54.4 kg   SpO2 95%   BMI 20.59 kg/m  Physical Exam Vitals and nursing note reviewed.  HENT:     Head: Atraumatic.  Cardiovascular:     Rate and Rhythm: Normal rate and regular rhythm.  Pulmonary:     Effort: Pulmonary effort is normal.     Breath sounds: No decreased breath sounds or wheezing.  Chest:     Chest wall: No tenderness.  Abdominal:     Tenderness: There is no abdominal tenderness.  Musculoskeletal:     Right lower leg: No edema.     Left lower leg: No edema.  Neurological:     Mental Status: She is alert.     ED Results / Procedures / Treatments   Labs (all labs ordered are listed, but only abnormal results are displayed) Labs Reviewed  RESP PANEL BY RT-PCR (FLU A&B, COVID) [id number]  BASIC METABOLIC PANEL  CBC  TROPONIN I (HIGH SENSITIVITY)    EKG EKG Interpretation  Date/Time:  Sunday March 16 2022 18:55:31 EDT Ventricular Rate:  72 PR Interval:  178 QRS Duration: 70 QT Interval:  394 QTC Calculation: 431 R Axis:   -43 Text Interpretation: Normal sinus rhythm Left axis deviation Low voltage QRS Cannot rule out Anterior infarct , age undetermined Abnormal ECG When compared with ECG of 08-Sep-2016 17:24, No significant change since last tracing Confirmed by Benjiman Core [phone]) on 03/16/2022 9:00:13 PM  Radiology DG Chest Port 1 View  Result Date: 03/16/2022 CLINICAL DATA:  Chest pain. EXAM: PORTABLE CHEST 1 VIEW COMPARISON:  Chest x-ray 01/04/2022.  CT of the chest 09/08/2016. FINDINGS: Reticulonodular opacities in the bilateral lower lungs appear  unchanged. No new focal lung infiltrate, pleural effusion or pneumothorax. Cardiomediastinal silhouette is stable, the heart is mildly enlarged. No acute fractures are seen. IMPRESSION: 1. Stable reticulonodular opacities in the bilateral lower lobes. No new focal lung infiltrate. Electronically Signed   By: Darliss Cheney M.D.   On: 03/16/2022 20:36    Procedures Procedures    Medications Ordered in ED Medications - No data to display  ED Course/ Medical Decision Making/ A&P                           Medical Decision Making Amount and/or Complexity of Data Reviewed Labs: ordered. Radiology: ordered.   Patient with URI symptoms and cough.  No sputum production.  Chest x-ray stable.  Negative flu and COVID testing.  EKG reassuring and interpreted by me.  Negative troponin.  Well-appearing and not hypoxic.  Appears stable for discharge.  Follow-up with PCP as needed.  Doubt cardiac ischemia.  Doubt pneumonia.       Final Clinical Impression(s) / ED Diagnoses Final diagnoses:  Nonspecific chest pain  Upper respiratory tract infection, unspecified type    Rx / DC Orders ED Discharge Orders     None         Benjiman Core, MD 03/16/22 2113

## 2022-03-16 NOTE — ED Triage Notes (Signed)
Patient here POV from [facility].  Endorses having Mild Intermittent CP for approximately 2 Days. States Cough is Mostly Dry and began [place]. Also endorses Dizziness Spells. States Dizziness usually occurs upon getting Up from Rest.   No Known Fevers.   NAD Noted during Triage. A&Ox4. GCS 15. Ambulatory.

## 2022-03-16 NOTE — ED Notes (Signed)
Pt daughter, medical POA, agreeable with d/c plan as discussed by provider- this nurse has verbally reinforced d/c instructions and provided poa with written copy- poa acknowledges verbal understanding and denies any additional questions, concerns, needs- pt escorted to exit via w/c escorted by this nurse with POA as pt driver

## 2022-03-17 DIAGNOSIS — M6281 Muscle weakness (generalized): Secondary | ICD-10-CM | POA: Diagnosis not present

## 2022-03-19 DIAGNOSIS — M6281 Muscle weakness (generalized): Secondary | ICD-10-CM | POA: Diagnosis not present

## 2022-03-20 DIAGNOSIS — R488 Other symbolic dysfunctions: Secondary | ICD-10-CM | POA: Diagnosis not present

## 2022-03-21 DIAGNOSIS — M6281 Muscle weakness (generalized): Secondary | ICD-10-CM | POA: Diagnosis not present

## 2022-03-21 DIAGNOSIS — R488 Other symbolic dysfunctions: Secondary | ICD-10-CM | POA: Diagnosis not present

## 2022-03-22 DIAGNOSIS — Z87891 Personal history of nicotine dependence: Secondary | ICD-10-CM | POA: Diagnosis not present

## 2022-03-22 DIAGNOSIS — S81801D Unspecified open wound, right lower leg, subsequent encounter: Secondary | ICD-10-CM | POA: Diagnosis not present

## 2022-03-22 DIAGNOSIS — Z85828 Personal history of other malignant neoplasm of skin: Secondary | ICD-10-CM | POA: Diagnosis not present

## 2022-03-22 DIAGNOSIS — Z9181 History of falling: Secondary | ICD-10-CM | POA: Diagnosis not present

## 2022-03-22 DIAGNOSIS — E538 Deficiency of other specified B group vitamins: Secondary | ICD-10-CM | POA: Diagnosis not present

## 2022-03-22 DIAGNOSIS — H3530 Unspecified macular degeneration: Secondary | ICD-10-CM | POA: Diagnosis not present

## 2022-03-22 DIAGNOSIS — H548 Legal blindness, as defined in USA: Secondary | ICD-10-CM | POA: Diagnosis not present

## 2022-03-22 DIAGNOSIS — E785 Hyperlipidemia, unspecified: Secondary | ICD-10-CM | POA: Diagnosis not present

## 2022-03-22 DIAGNOSIS — I10 Essential (primary) hypertension: Secondary | ICD-10-CM | POA: Diagnosis not present

## 2022-03-25 DIAGNOSIS — H3530 Unspecified macular degeneration: Secondary | ICD-10-CM | POA: Diagnosis not present

## 2022-03-25 DIAGNOSIS — E785 Hyperlipidemia, unspecified: Secondary | ICD-10-CM | POA: Diagnosis not present

## 2022-03-25 DIAGNOSIS — E538 Deficiency of other specified B group vitamins: Secondary | ICD-10-CM | POA: Diagnosis not present

## 2022-03-25 DIAGNOSIS — Z87891 Personal history of nicotine dependence: Secondary | ICD-10-CM | POA: Diagnosis not present

## 2022-03-25 DIAGNOSIS — H548 Legal blindness, as defined in USA: Secondary | ICD-10-CM | POA: Diagnosis not present

## 2022-03-25 DIAGNOSIS — Z9181 History of falling: Secondary | ICD-10-CM | POA: Diagnosis not present

## 2022-03-25 DIAGNOSIS — I10 Essential (primary) hypertension: Secondary | ICD-10-CM | POA: Diagnosis not present

## 2022-03-25 DIAGNOSIS — S81801D Unspecified open wound, right lower leg, subsequent encounter: Secondary | ICD-10-CM | POA: Diagnosis not present

## 2022-03-25 DIAGNOSIS — Z85828 Personal history of other malignant neoplasm of skin: Secondary | ICD-10-CM | POA: Diagnosis not present

## 2022-03-28 DIAGNOSIS — Z85828 Personal history of other malignant neoplasm of skin: Secondary | ICD-10-CM | POA: Diagnosis not present

## 2022-03-28 DIAGNOSIS — E538 Deficiency of other specified B group vitamins: Secondary | ICD-10-CM | POA: Diagnosis not present

## 2022-03-28 DIAGNOSIS — E785 Hyperlipidemia, unspecified: Secondary | ICD-10-CM | POA: Diagnosis not present

## 2022-03-28 DIAGNOSIS — Z9181 History of falling: Secondary | ICD-10-CM | POA: Diagnosis not present

## 2022-03-28 DIAGNOSIS — H548 Legal blindness, as defined in USA: Secondary | ICD-10-CM | POA: Diagnosis not present

## 2022-03-28 DIAGNOSIS — H3530 Unspecified macular degeneration: Secondary | ICD-10-CM | POA: Diagnosis not present

## 2022-03-28 DIAGNOSIS — Z87891 Personal history of nicotine dependence: Secondary | ICD-10-CM | POA: Diagnosis not present

## 2022-03-28 DIAGNOSIS — S81801D Unspecified open wound, right lower leg, subsequent encounter: Secondary | ICD-10-CM | POA: Diagnosis not present

## 2022-03-28 DIAGNOSIS — I10 Essential (primary) hypertension: Secondary | ICD-10-CM | POA: Diagnosis not present

## 2022-03-31 DIAGNOSIS — Z85828 Personal history of other malignant neoplasm of skin: Secondary | ICD-10-CM | POA: Diagnosis not present

## 2022-03-31 DIAGNOSIS — H548 Legal blindness, as defined in USA: Secondary | ICD-10-CM | POA: Diagnosis not present

## 2022-03-31 DIAGNOSIS — H3530 Unspecified macular degeneration: Secondary | ICD-10-CM | POA: Diagnosis not present

## 2022-03-31 DIAGNOSIS — Z87891 Personal history of nicotine dependence: Secondary | ICD-10-CM | POA: Diagnosis not present

## 2022-03-31 DIAGNOSIS — E538 Deficiency of other specified B group vitamins: Secondary | ICD-10-CM | POA: Diagnosis not present

## 2022-03-31 DIAGNOSIS — Z9181 History of falling: Secondary | ICD-10-CM | POA: Diagnosis not present

## 2022-03-31 DIAGNOSIS — E785 Hyperlipidemia, unspecified: Secondary | ICD-10-CM | POA: Diagnosis not present

## 2022-03-31 DIAGNOSIS — S81801D Unspecified open wound, right lower leg, subsequent encounter: Secondary | ICD-10-CM | POA: Diagnosis not present

## 2022-03-31 DIAGNOSIS — I10 Essential (primary) hypertension: Secondary | ICD-10-CM | POA: Diagnosis not present

## 2022-04-02 DIAGNOSIS — E538 Deficiency of other specified B group vitamins: Secondary | ICD-10-CM | POA: Diagnosis not present

## 2022-04-03 DIAGNOSIS — Z87891 Personal history of nicotine dependence: Secondary | ICD-10-CM | POA: Diagnosis not present

## 2022-04-03 DIAGNOSIS — Z85828 Personal history of other malignant neoplasm of skin: Secondary | ICD-10-CM | POA: Diagnosis not present

## 2022-04-03 DIAGNOSIS — E785 Hyperlipidemia, unspecified: Secondary | ICD-10-CM | POA: Diagnosis not present

## 2022-04-03 DIAGNOSIS — I10 Essential (primary) hypertension: Secondary | ICD-10-CM | POA: Diagnosis not present

## 2022-04-03 DIAGNOSIS — S81801D Unspecified open wound, right lower leg, subsequent encounter: Secondary | ICD-10-CM | POA: Diagnosis not present

## 2022-04-03 DIAGNOSIS — H548 Legal blindness, as defined in USA: Secondary | ICD-10-CM | POA: Diagnosis not present

## 2022-04-03 DIAGNOSIS — H3530 Unspecified macular degeneration: Secondary | ICD-10-CM | POA: Diagnosis not present

## 2022-04-03 DIAGNOSIS — Z9181 History of falling: Secondary | ICD-10-CM | POA: Diagnosis not present

## 2022-04-03 DIAGNOSIS — E538 Deficiency of other specified B group vitamins: Secondary | ICD-10-CM | POA: Diagnosis not present

## 2022-04-04 DIAGNOSIS — H548 Legal blindness, as defined in USA: Secondary | ICD-10-CM | POA: Diagnosis not present

## 2022-04-04 DIAGNOSIS — I10 Essential (primary) hypertension: Secondary | ICD-10-CM | POA: Diagnosis not present

## 2022-04-04 DIAGNOSIS — H3530 Unspecified macular degeneration: Secondary | ICD-10-CM | POA: Diagnosis not present

## 2022-04-04 DIAGNOSIS — Z9181 History of falling: Secondary | ICD-10-CM | POA: Diagnosis not present

## 2022-04-04 DIAGNOSIS — Z85828 Personal history of other malignant neoplasm of skin: Secondary | ICD-10-CM | POA: Diagnosis not present

## 2022-04-04 DIAGNOSIS — S81801D Unspecified open wound, right lower leg, subsequent encounter: Secondary | ICD-10-CM | POA: Diagnosis not present

## 2022-04-04 DIAGNOSIS — E538 Deficiency of other specified B group vitamins: Secondary | ICD-10-CM | POA: Diagnosis not present

## 2022-04-04 DIAGNOSIS — Z87891 Personal history of nicotine dependence: Secondary | ICD-10-CM | POA: Diagnosis not present

## 2022-04-04 DIAGNOSIS — E785 Hyperlipidemia, unspecified: Secondary | ICD-10-CM | POA: Diagnosis not present

## 2022-04-08 DIAGNOSIS — E538 Deficiency of other specified B group vitamins: Secondary | ICD-10-CM | POA: Diagnosis not present

## 2022-04-08 DIAGNOSIS — I10 Essential (primary) hypertension: Secondary | ICD-10-CM | POA: Diagnosis not present

## 2022-04-08 DIAGNOSIS — E785 Hyperlipidemia, unspecified: Secondary | ICD-10-CM | POA: Diagnosis not present

## 2022-04-08 DIAGNOSIS — Z9181 History of falling: Secondary | ICD-10-CM | POA: Diagnosis not present

## 2022-04-08 DIAGNOSIS — H3530 Unspecified macular degeneration: Secondary | ICD-10-CM | POA: Diagnosis not present

## 2022-04-08 DIAGNOSIS — S81801D Unspecified open wound, right lower leg, subsequent encounter: Secondary | ICD-10-CM | POA: Diagnosis not present

## 2022-04-08 DIAGNOSIS — Z87891 Personal history of nicotine dependence: Secondary | ICD-10-CM | POA: Diagnosis not present

## 2022-04-08 DIAGNOSIS — Z85828 Personal history of other malignant neoplasm of skin: Secondary | ICD-10-CM | POA: Diagnosis not present

## 2022-04-08 DIAGNOSIS — H548 Legal blindness, as defined in USA: Secondary | ICD-10-CM | POA: Diagnosis not present

## 2022-04-09 DIAGNOSIS — U071 COVID-19: Secondary | ICD-10-CM | POA: Diagnosis not present

## 2022-04-09 DIAGNOSIS — J019 Acute sinusitis, unspecified: Secondary | ICD-10-CM | POA: Diagnosis not present

## 2022-04-09 DIAGNOSIS — R0981 Nasal congestion: Secondary | ICD-10-CM | POA: Diagnosis not present

## 2022-04-09 DIAGNOSIS — R5383 Other fatigue: Secondary | ICD-10-CM | POA: Diagnosis not present

## 2022-04-10 DIAGNOSIS — H3530 Unspecified macular degeneration: Secondary | ICD-10-CM | POA: Diagnosis not present

## 2022-04-10 DIAGNOSIS — E785 Hyperlipidemia, unspecified: Secondary | ICD-10-CM | POA: Diagnosis not present

## 2022-04-10 DIAGNOSIS — S81801D Unspecified open wound, right lower leg, subsequent encounter: Secondary | ICD-10-CM | POA: Diagnosis not present

## 2022-04-10 DIAGNOSIS — Z87891 Personal history of nicotine dependence: Secondary | ICD-10-CM | POA: Diagnosis not present

## 2022-04-10 DIAGNOSIS — H548 Legal blindness, as defined in USA: Secondary | ICD-10-CM | POA: Diagnosis not present

## 2022-04-10 DIAGNOSIS — E538 Deficiency of other specified B group vitamins: Secondary | ICD-10-CM | POA: Diagnosis not present

## 2022-04-10 DIAGNOSIS — Z9181 History of falling: Secondary | ICD-10-CM | POA: Diagnosis not present

## 2022-04-10 DIAGNOSIS — Z85828 Personal history of other malignant neoplasm of skin: Secondary | ICD-10-CM | POA: Diagnosis not present

## 2022-04-10 DIAGNOSIS — I10 Essential (primary) hypertension: Secondary | ICD-10-CM | POA: Diagnosis not present

## 2022-04-14 DIAGNOSIS — E538 Deficiency of other specified B group vitamins: Secondary | ICD-10-CM | POA: Diagnosis not present

## 2022-04-14 DIAGNOSIS — Z85828 Personal history of other malignant neoplasm of skin: Secondary | ICD-10-CM | POA: Diagnosis not present

## 2022-04-14 DIAGNOSIS — H548 Legal blindness, as defined in USA: Secondary | ICD-10-CM | POA: Diagnosis not present

## 2022-04-14 DIAGNOSIS — Z9181 History of falling: Secondary | ICD-10-CM | POA: Diagnosis not present

## 2022-04-14 DIAGNOSIS — E785 Hyperlipidemia, unspecified: Secondary | ICD-10-CM | POA: Diagnosis not present

## 2022-04-14 DIAGNOSIS — H3530 Unspecified macular degeneration: Secondary | ICD-10-CM | POA: Diagnosis not present

## 2022-04-14 DIAGNOSIS — Z87891 Personal history of nicotine dependence: Secondary | ICD-10-CM | POA: Diagnosis not present

## 2022-04-14 DIAGNOSIS — I10 Essential (primary) hypertension: Secondary | ICD-10-CM | POA: Diagnosis not present

## 2022-04-14 DIAGNOSIS — S81801D Unspecified open wound, right lower leg, subsequent encounter: Secondary | ICD-10-CM | POA: Diagnosis not present

## 2022-04-17 DIAGNOSIS — E538 Deficiency of other specified B group vitamins: Secondary | ICD-10-CM | POA: Diagnosis not present

## 2022-04-17 DIAGNOSIS — Z9181 History of falling: Secondary | ICD-10-CM | POA: Diagnosis not present

## 2022-04-17 DIAGNOSIS — Z85828 Personal history of other malignant neoplasm of skin: Secondary | ICD-10-CM | POA: Diagnosis not present

## 2022-04-17 DIAGNOSIS — E785 Hyperlipidemia, unspecified: Secondary | ICD-10-CM | POA: Diagnosis not present

## 2022-04-17 DIAGNOSIS — S81801D Unspecified open wound, right lower leg, subsequent encounter: Secondary | ICD-10-CM | POA: Diagnosis not present

## 2022-04-17 DIAGNOSIS — H548 Legal blindness, as defined in USA: Secondary | ICD-10-CM | POA: Diagnosis not present

## 2022-04-17 DIAGNOSIS — Z87891 Personal history of nicotine dependence: Secondary | ICD-10-CM | POA: Diagnosis not present

## 2022-04-17 DIAGNOSIS — I10 Essential (primary) hypertension: Secondary | ICD-10-CM | POA: Diagnosis not present

## 2022-04-17 DIAGNOSIS — H3530 Unspecified macular degeneration: Secondary | ICD-10-CM | POA: Diagnosis not present

## 2022-04-22 DIAGNOSIS — Z85828 Personal history of other malignant neoplasm of skin: Secondary | ICD-10-CM | POA: Diagnosis not present

## 2022-04-22 DIAGNOSIS — E538 Deficiency of other specified B group vitamins: Secondary | ICD-10-CM | POA: Diagnosis not present

## 2022-04-22 DIAGNOSIS — H548 Legal blindness, as defined in USA: Secondary | ICD-10-CM | POA: Diagnosis not present

## 2022-04-22 DIAGNOSIS — Z0000 Encounter for general adult medical examination without abnormal findings: Secondary | ICD-10-CM | POA: Diagnosis not present

## 2022-04-22 DIAGNOSIS — I10 Essential (primary) hypertension: Secondary | ICD-10-CM | POA: Diagnosis not present

## 2022-04-22 DIAGNOSIS — E785 Hyperlipidemia, unspecified: Secondary | ICD-10-CM | POA: Diagnosis not present

## 2022-04-22 DIAGNOSIS — H3530 Unspecified macular degeneration: Secondary | ICD-10-CM | POA: Diagnosis not present

## 2022-04-22 DIAGNOSIS — Z9181 History of falling: Secondary | ICD-10-CM | POA: Diagnosis not present

## 2022-04-22 DIAGNOSIS — S81801D Unspecified open wound, right lower leg, subsequent encounter: Secondary | ICD-10-CM | POA: Diagnosis not present

## 2022-04-22 DIAGNOSIS — Z87891 Personal history of nicotine dependence: Secondary | ICD-10-CM | POA: Diagnosis not present

## 2022-04-24 DIAGNOSIS — E785 Hyperlipidemia, unspecified: Secondary | ICD-10-CM | POA: Diagnosis not present

## 2022-04-24 DIAGNOSIS — I10 Essential (primary) hypertension: Secondary | ICD-10-CM | POA: Diagnosis not present

## 2022-04-24 DIAGNOSIS — Z85828 Personal history of other malignant neoplasm of skin: Secondary | ICD-10-CM | POA: Diagnosis not present

## 2022-04-24 DIAGNOSIS — H3530 Unspecified macular degeneration: Secondary | ICD-10-CM | POA: Diagnosis not present

## 2022-04-24 DIAGNOSIS — Z87891 Personal history of nicotine dependence: Secondary | ICD-10-CM | POA: Diagnosis not present

## 2022-04-24 DIAGNOSIS — S81801D Unspecified open wound, right lower leg, subsequent encounter: Secondary | ICD-10-CM | POA: Diagnosis not present

## 2022-04-24 DIAGNOSIS — Z9181 History of falling: Secondary | ICD-10-CM | POA: Diagnosis not present

## 2022-04-24 DIAGNOSIS — E538 Deficiency of other specified B group vitamins: Secondary | ICD-10-CM | POA: Diagnosis not present

## 2022-04-24 DIAGNOSIS — H548 Legal blindness, as defined in USA: Secondary | ICD-10-CM | POA: Diagnosis not present

## 2022-04-25 DIAGNOSIS — E785 Hyperlipidemia, unspecified: Secondary | ICD-10-CM | POA: Diagnosis not present

## 2022-04-25 DIAGNOSIS — Z9181 History of falling: Secondary | ICD-10-CM | POA: Diagnosis not present

## 2022-04-25 DIAGNOSIS — Z85828 Personal history of other malignant neoplasm of skin: Secondary | ICD-10-CM | POA: Diagnosis not present

## 2022-04-25 DIAGNOSIS — H548 Legal blindness, as defined in USA: Secondary | ICD-10-CM | POA: Diagnosis not present

## 2022-04-25 DIAGNOSIS — Z87891 Personal history of nicotine dependence: Secondary | ICD-10-CM | POA: Diagnosis not present

## 2022-04-25 DIAGNOSIS — H3530 Unspecified macular degeneration: Secondary | ICD-10-CM | POA: Diagnosis not present

## 2022-04-25 DIAGNOSIS — I10 Essential (primary) hypertension: Secondary | ICD-10-CM | POA: Diagnosis not present

## 2022-04-25 DIAGNOSIS — S81801D Unspecified open wound, right lower leg, subsequent encounter: Secondary | ICD-10-CM | POA: Diagnosis not present

## 2022-04-25 DIAGNOSIS — E538 Deficiency of other specified B group vitamins: Secondary | ICD-10-CM | POA: Diagnosis not present

## 2022-04-28 DIAGNOSIS — H3530 Unspecified macular degeneration: Secondary | ICD-10-CM | POA: Diagnosis not present

## 2022-04-28 DIAGNOSIS — S81801D Unspecified open wound, right lower leg, subsequent encounter: Secondary | ICD-10-CM | POA: Diagnosis not present

## 2022-04-28 DIAGNOSIS — H548 Legal blindness, as defined in USA: Secondary | ICD-10-CM | POA: Diagnosis not present

## 2022-04-28 DIAGNOSIS — Z87891 Personal history of nicotine dependence: Secondary | ICD-10-CM | POA: Diagnosis not present

## 2022-04-28 DIAGNOSIS — I10 Essential (primary) hypertension: Secondary | ICD-10-CM | POA: Diagnosis not present

## 2022-04-28 DIAGNOSIS — E538 Deficiency of other specified B group vitamins: Secondary | ICD-10-CM | POA: Diagnosis not present

## 2022-04-28 DIAGNOSIS — Z85828 Personal history of other malignant neoplasm of skin: Secondary | ICD-10-CM | POA: Diagnosis not present

## 2022-04-28 DIAGNOSIS — E785 Hyperlipidemia, unspecified: Secondary | ICD-10-CM | POA: Diagnosis not present

## 2022-04-28 DIAGNOSIS — Z9181 History of falling: Secondary | ICD-10-CM | POA: Diagnosis not present

## 2022-04-29 DIAGNOSIS — E785 Hyperlipidemia, unspecified: Secondary | ICD-10-CM | POA: Diagnosis not present

## 2022-04-29 DIAGNOSIS — H3530 Unspecified macular degeneration: Secondary | ICD-10-CM | POA: Diagnosis not present

## 2022-04-29 DIAGNOSIS — I10 Essential (primary) hypertension: Secondary | ICD-10-CM | POA: Diagnosis not present

## 2022-04-29 DIAGNOSIS — Z85828 Personal history of other malignant neoplasm of skin: Secondary | ICD-10-CM | POA: Diagnosis not present

## 2022-04-29 DIAGNOSIS — Z87891 Personal history of nicotine dependence: Secondary | ICD-10-CM | POA: Diagnosis not present

## 2022-04-29 DIAGNOSIS — S81801D Unspecified open wound, right lower leg, subsequent encounter: Secondary | ICD-10-CM | POA: Diagnosis not present

## 2022-04-29 DIAGNOSIS — E538 Deficiency of other specified B group vitamins: Secondary | ICD-10-CM | POA: Diagnosis not present

## 2022-04-29 DIAGNOSIS — H548 Legal blindness, as defined in USA: Secondary | ICD-10-CM | POA: Diagnosis not present

## 2022-04-29 DIAGNOSIS — Z9181 History of falling: Secondary | ICD-10-CM | POA: Diagnosis not present

## 2022-04-30 DIAGNOSIS — E785 Hyperlipidemia, unspecified: Secondary | ICD-10-CM | POA: Diagnosis not present

## 2022-04-30 DIAGNOSIS — Z87891 Personal history of nicotine dependence: Secondary | ICD-10-CM | POA: Diagnosis not present

## 2022-04-30 DIAGNOSIS — H3530 Unspecified macular degeneration: Secondary | ICD-10-CM | POA: Diagnosis not present

## 2022-04-30 DIAGNOSIS — Z9181 History of falling: Secondary | ICD-10-CM | POA: Diagnosis not present

## 2022-04-30 DIAGNOSIS — S81801D Unspecified open wound, right lower leg, subsequent encounter: Secondary | ICD-10-CM | POA: Diagnosis not present

## 2022-04-30 DIAGNOSIS — Z85828 Personal history of other malignant neoplasm of skin: Secondary | ICD-10-CM | POA: Diagnosis not present

## 2022-04-30 DIAGNOSIS — I10 Essential (primary) hypertension: Secondary | ICD-10-CM | POA: Diagnosis not present

## 2022-04-30 DIAGNOSIS — H548 Legal blindness, as defined in USA: Secondary | ICD-10-CM | POA: Diagnosis not present

## 2022-04-30 DIAGNOSIS — E538 Deficiency of other specified B group vitamins: Secondary | ICD-10-CM | POA: Diagnosis not present

## 2022-05-02 DIAGNOSIS — Z0000 Encounter for general adult medical examination without abnormal findings: Secondary | ICD-10-CM | POA: Diagnosis not present

## 2022-05-05 DIAGNOSIS — H903 Sensorineural hearing loss, bilateral: Secondary | ICD-10-CM | POA: Diagnosis not present

## 2022-05-06 DIAGNOSIS — Z85828 Personal history of other malignant neoplasm of skin: Secondary | ICD-10-CM | POA: Diagnosis not present

## 2022-05-06 DIAGNOSIS — Z87891 Personal history of nicotine dependence: Secondary | ICD-10-CM | POA: Diagnosis not present

## 2022-05-06 DIAGNOSIS — I10 Essential (primary) hypertension: Secondary | ICD-10-CM | POA: Diagnosis not present

## 2022-05-06 DIAGNOSIS — E538 Deficiency of other specified B group vitamins: Secondary | ICD-10-CM | POA: Diagnosis not present

## 2022-05-06 DIAGNOSIS — Z9181 History of falling: Secondary | ICD-10-CM | POA: Diagnosis not present

## 2022-05-06 DIAGNOSIS — E785 Hyperlipidemia, unspecified: Secondary | ICD-10-CM | POA: Diagnosis not present

## 2022-05-06 DIAGNOSIS — H548 Legal blindness, as defined in USA: Secondary | ICD-10-CM | POA: Diagnosis not present

## 2022-05-06 DIAGNOSIS — H3530 Unspecified macular degeneration: Secondary | ICD-10-CM | POA: Diagnosis not present

## 2022-05-06 DIAGNOSIS — S81801D Unspecified open wound, right lower leg, subsequent encounter: Secondary | ICD-10-CM | POA: Diagnosis not present

## 2022-05-07 DIAGNOSIS — H3530 Unspecified macular degeneration: Secondary | ICD-10-CM | POA: Diagnosis not present

## 2022-05-07 DIAGNOSIS — E785 Hyperlipidemia, unspecified: Secondary | ICD-10-CM | POA: Diagnosis not present

## 2022-05-07 DIAGNOSIS — I10 Essential (primary) hypertension: Secondary | ICD-10-CM | POA: Diagnosis not present

## 2022-05-07 DIAGNOSIS — S81801D Unspecified open wound, right lower leg, subsequent encounter: Secondary | ICD-10-CM | POA: Diagnosis not present

## 2022-05-07 DIAGNOSIS — Z9181 History of falling: Secondary | ICD-10-CM | POA: Diagnosis not present

## 2022-05-07 DIAGNOSIS — Z85828 Personal history of other malignant neoplasm of skin: Secondary | ICD-10-CM | POA: Diagnosis not present

## 2022-05-07 DIAGNOSIS — H548 Legal blindness, as defined in USA: Secondary | ICD-10-CM | POA: Diagnosis not present

## 2022-05-07 DIAGNOSIS — E538 Deficiency of other specified B group vitamins: Secondary | ICD-10-CM | POA: Diagnosis not present

## 2022-05-07 DIAGNOSIS — Z87891 Personal history of nicotine dependence: Secondary | ICD-10-CM | POA: Diagnosis not present

## 2022-05-14 DIAGNOSIS — H3530 Unspecified macular degeneration: Secondary | ICD-10-CM | POA: Diagnosis not present

## 2022-05-14 DIAGNOSIS — E538 Deficiency of other specified B group vitamins: Secondary | ICD-10-CM | POA: Diagnosis not present

## 2022-05-14 DIAGNOSIS — I10 Essential (primary) hypertension: Secondary | ICD-10-CM | POA: Diagnosis not present

## 2022-05-14 DIAGNOSIS — H548 Legal blindness, as defined in USA: Secondary | ICD-10-CM | POA: Diagnosis not present

## 2022-05-14 DIAGNOSIS — Z9181 History of falling: Secondary | ICD-10-CM | POA: Diagnosis not present

## 2022-05-14 DIAGNOSIS — S81801D Unspecified open wound, right lower leg, subsequent encounter: Secondary | ICD-10-CM | POA: Diagnosis not present

## 2022-05-14 DIAGNOSIS — Z85828 Personal history of other malignant neoplasm of skin: Secondary | ICD-10-CM | POA: Diagnosis not present

## 2022-05-14 DIAGNOSIS — Z87891 Personal history of nicotine dependence: Secondary | ICD-10-CM | POA: Diagnosis not present

## 2022-05-14 DIAGNOSIS — E785 Hyperlipidemia, unspecified: Secondary | ICD-10-CM | POA: Diagnosis not present

## 2022-05-15 DIAGNOSIS — Z9181 History of falling: Secondary | ICD-10-CM | POA: Diagnosis not present

## 2022-05-15 DIAGNOSIS — S81801D Unspecified open wound, right lower leg, subsequent encounter: Secondary | ICD-10-CM | POA: Diagnosis not present

## 2022-05-15 DIAGNOSIS — Z87891 Personal history of nicotine dependence: Secondary | ICD-10-CM | POA: Diagnosis not present

## 2022-05-15 DIAGNOSIS — E538 Deficiency of other specified B group vitamins: Secondary | ICD-10-CM | POA: Diagnosis not present

## 2022-05-15 DIAGNOSIS — I10 Essential (primary) hypertension: Secondary | ICD-10-CM | POA: Diagnosis not present

## 2022-05-15 DIAGNOSIS — H3530 Unspecified macular degeneration: Secondary | ICD-10-CM | POA: Diagnosis not present

## 2022-05-15 DIAGNOSIS — Z85828 Personal history of other malignant neoplasm of skin: Secondary | ICD-10-CM | POA: Diagnosis not present

## 2022-05-15 DIAGNOSIS — E785 Hyperlipidemia, unspecified: Secondary | ICD-10-CM | POA: Diagnosis not present

## 2022-05-15 DIAGNOSIS — H548 Legal blindness, as defined in USA: Secondary | ICD-10-CM | POA: Diagnosis not present

## 2022-05-19 DIAGNOSIS — H548 Legal blindness, as defined in USA: Secondary | ICD-10-CM | POA: Diagnosis not present

## 2022-05-19 DIAGNOSIS — Z85828 Personal history of other malignant neoplasm of skin: Secondary | ICD-10-CM | POA: Diagnosis not present

## 2022-05-19 DIAGNOSIS — Z87891 Personal history of nicotine dependence: Secondary | ICD-10-CM | POA: Diagnosis not present

## 2022-05-19 DIAGNOSIS — Z9181 History of falling: Secondary | ICD-10-CM | POA: Diagnosis not present

## 2022-05-19 DIAGNOSIS — E785 Hyperlipidemia, unspecified: Secondary | ICD-10-CM | POA: Diagnosis not present

## 2022-05-19 DIAGNOSIS — I10 Essential (primary) hypertension: Secondary | ICD-10-CM | POA: Diagnosis not present

## 2022-05-19 DIAGNOSIS — S81801D Unspecified open wound, right lower leg, subsequent encounter: Secondary | ICD-10-CM | POA: Diagnosis not present

## 2022-05-19 DIAGNOSIS — H3530 Unspecified macular degeneration: Secondary | ICD-10-CM | POA: Diagnosis not present

## 2022-05-19 DIAGNOSIS — E538 Deficiency of other specified B group vitamins: Secondary | ICD-10-CM | POA: Diagnosis not present

## 2022-05-23 DIAGNOSIS — E538 Deficiency of other specified B group vitamins: Secondary | ICD-10-CM | POA: Diagnosis not present

## 2022-06-04 ENCOUNTER — Encounter (HOSPITAL_COMMUNITY): Payer: Self-pay | Admitting: Emergency Medicine

## 2022-06-04 ENCOUNTER — Inpatient Hospital Stay (HOSPITAL_COMMUNITY)
Admission: EM | Admit: 2022-06-04 | Discharge: 2022-06-09 | DRG: 522 | Disposition: A | Discharge status: Skilled Nursing Facility | Payer: Medicare Other | Source: Skilled Nursing Facility | Attending: Internal Medicine | Admitting: Internal Medicine

## 2022-06-04 ENCOUNTER — Emergency Department (HOSPITAL_COMMUNITY): Payer: Medicare Other

## 2022-06-04 ENCOUNTER — Other Ambulatory Visit: Payer: Self-pay

## 2022-06-04 DIAGNOSIS — E785 Hyperlipidemia, unspecified: Secondary | ICD-10-CM | POA: Diagnosis not present

## 2022-06-04 DIAGNOSIS — W19XXXA Unspecified fall, initial encounter: Principal | ICD-10-CM

## 2022-06-04 DIAGNOSIS — M11262 Other chondrocalcinosis, left knee: Secondary | ICD-10-CM | POA: Diagnosis not present

## 2022-06-04 DIAGNOSIS — H548 Legal blindness, as defined in USA: Secondary | ICD-10-CM | POA: Diagnosis present

## 2022-06-04 DIAGNOSIS — W1839XA Other fall on same level, initial encounter: Secondary | ICD-10-CM | POA: Diagnosis present

## 2022-06-04 DIAGNOSIS — I110 Hypertensive heart disease with heart failure: Secondary | ICD-10-CM | POA: Diagnosis not present

## 2022-06-04 DIAGNOSIS — J449 Chronic obstructive pulmonary disease, unspecified: Secondary | ICD-10-CM | POA: Diagnosis not present

## 2022-06-04 DIAGNOSIS — S72012A Unspecified intracapsular fracture of left femur, initial encounter for closed fracture: Principal | ICD-10-CM | POA: Diagnosis present

## 2022-06-04 DIAGNOSIS — E440 Moderate protein-calorie malnutrition: Secondary | ICD-10-CM | POA: Diagnosis not present

## 2022-06-04 DIAGNOSIS — I10 Essential (primary) hypertension: Secondary | ICD-10-CM | POA: Diagnosis not present

## 2022-06-04 DIAGNOSIS — K219 Gastro-esophageal reflux disease without esophagitis: Secondary | ICD-10-CM | POA: Diagnosis not present

## 2022-06-04 DIAGNOSIS — M6281 Muscle weakness (generalized): Secondary | ICD-10-CM | POA: Diagnosis not present

## 2022-06-04 DIAGNOSIS — S72002A Fracture of unspecified part of neck of left femur, initial encounter for closed fracture: Secondary | ICD-10-CM | POA: Diagnosis not present

## 2022-06-04 DIAGNOSIS — Z681 Body mass index (BMI) 19 or less, adult: Secondary | ICD-10-CM

## 2022-06-04 DIAGNOSIS — Z96642 Presence of left artificial hip joint: Secondary | ICD-10-CM | POA: Diagnosis not present

## 2022-06-04 DIAGNOSIS — F32A Depression, unspecified: Secondary | ICD-10-CM | POA: Diagnosis not present

## 2022-06-04 DIAGNOSIS — D72829 Elevated white blood cell count, unspecified: Secondary | ICD-10-CM | POA: Diagnosis not present

## 2022-06-04 DIAGNOSIS — D649 Anemia, unspecified: Secondary | ICD-10-CM | POA: Diagnosis not present

## 2022-06-04 DIAGNOSIS — Z0389 Encounter for observation for other suspected diseases and conditions ruled out: Secondary | ICD-10-CM | POA: Diagnosis not present

## 2022-06-04 DIAGNOSIS — Z8781 Personal history of (healed) traumatic fracture: Secondary | ICD-10-CM | POA: Diagnosis not present

## 2022-06-04 DIAGNOSIS — Y92129 Unspecified place in nursing home as the place of occurrence of the external cause: Secondary | ICD-10-CM | POA: Diagnosis not present

## 2022-06-04 DIAGNOSIS — M25552 Pain in left hip: Secondary | ICD-10-CM | POA: Diagnosis not present

## 2022-06-04 DIAGNOSIS — R9431 Abnormal electrocardiogram [ECG] [EKG]: Secondary | ICD-10-CM | POA: Diagnosis not present

## 2022-06-04 DIAGNOSIS — S72002D Fracture of unspecified part of neck of left femur, subsequent encounter for closed fracture with routine healing: Secondary | ICD-10-CM | POA: Diagnosis not present

## 2022-06-04 DIAGNOSIS — K5900 Constipation, unspecified: Secondary | ICD-10-CM | POA: Diagnosis not present

## 2022-06-04 DIAGNOSIS — R2689 Other abnormalities of gait and mobility: Secondary | ICD-10-CM | POA: Diagnosis not present

## 2022-06-04 DIAGNOSIS — Z87891 Personal history of nicotine dependence: Secondary | ICD-10-CM | POA: Diagnosis not present

## 2022-06-04 DIAGNOSIS — R278 Other lack of coordination: Secondary | ICD-10-CM | POA: Diagnosis not present

## 2022-06-04 DIAGNOSIS — Z471 Aftercare following joint replacement surgery: Secondary | ICD-10-CM | POA: Diagnosis not present

## 2022-06-04 DIAGNOSIS — I5032 Chronic diastolic (congestive) heart failure: Secondary | ICD-10-CM | POA: Diagnosis present

## 2022-06-04 DIAGNOSIS — Z01818 Encounter for other preprocedural examination: Secondary | ICD-10-CM | POA: Diagnosis not present

## 2022-06-04 DIAGNOSIS — K279 Peptic ulcer, site unspecified, unspecified as acute or chronic, without hemorrhage or perforation: Secondary | ICD-10-CM | POA: Diagnosis not present

## 2022-06-04 DIAGNOSIS — M79643 Pain in unspecified hand: Secondary | ICD-10-CM | POA: Diagnosis not present

## 2022-06-04 LAB — CBC WITH DIFFERENTIAL/PLATELET
Abs Immature Granulocytes: 0.05 10*3/uL (ref 0.00–0.07)
Basophils Absolute: 0 10*3/uL (ref 0.0–0.1)
Basophils Relative: 0 %
Eosinophils Absolute: 0.1 10*3/uL (ref 0.0–0.5)
Eosinophils Relative: 1 %
HCT: 39.6 % (ref 36.0–46.0)
Hemoglobin: 12.8 g/dL (ref 12.0–15.0)
Immature Granulocytes: 1 %
Lymphocytes Relative: 15 %
Lymphs Abs: 1.4 10*3/uL (ref 0.7–4.0)
MCH: 28.6 pg (ref 26.0–34.0)
MCHC: 32.3 g/dL (ref 30.0–36.0)
MCV: 88.6 fL (ref 80.0–100.0)
Monocytes Absolute: 0.7 10*3/uL (ref 0.1–1.0)
Monocytes Relative: 8 %
Neutro Abs: 6.7 10*3/uL (ref 1.7–7.7)
Neutrophils Relative %: 75 %
Platelets: 313 10*3/uL (ref 150–400)
RBC: 4.47 MIL/uL (ref 3.87–5.11)
RDW: 14.1 % (ref 11.5–15.5)
WBC: 9 10*3/uL (ref 4.0–10.5)
nRBC: 0 % (ref 0.0–0.2)

## 2022-06-04 LAB — BASIC METABOLIC PANEL
Anion gap: 7 (ref 5–15)
BUN: 16 mg/dL (ref 8–23)
CO2: 26 mmol/L (ref 22–32)
Calcium: 8.8 mg/dL — ABNORMAL LOW (ref 8.9–10.3)
Chloride: 106 mmol/L (ref 98–111)
Creatinine, Ser: 0.57 mg/dL (ref 0.44–1.00)
GFR, Estimated: >60 mL/min (ref >60)
Glucose, Bld: 102 mg/dL — ABNORMAL HIGH (ref 70–99)
Potassium: 4.4 mmol/L (ref 3.5–5.1)
Sodium: 139 mmol/L (ref 135–145)

## 2022-06-04 LAB — CK: Total CK: 74 U/L (ref 38–234)

## 2022-06-04 MED ORDER — FENTANYL CITRATE PF 50 MCG/ML IJ SOSY
25.0000 ug | PREFILLED_SYRINGE | Freq: Once | INTRAMUSCULAR | Status: AC
  Administered 2022-06-04: 25 ug via INTRAVENOUS
  Filled 2022-06-04: qty 1

## 2022-06-04 MED ORDER — HYDROMORPHONE HCL 1 MG/ML IJ SOLN
0.5000 mg | Freq: Once | INTRAMUSCULAR | Status: AC
  Administered 2022-06-04: 0.5 mg via INTRAVENOUS
  Filled 2022-06-04: qty 1

## 2022-06-04 NOTE — H&P (View-Only) (Signed)
Chief Complaint: Left hip fracture  History: Dana Blake is a very pleasant 88 year old woman who had a mechanical fall and has been unable to ambulate.  Patient was brought to the emergency room for further work-up and treatment.  Imaging studies demonstrated a left renal neck fracture.  Patient denies any recent fevers, chills, weight loss, loss of consciousness.  Past Medical History:  Diagnosis Date   Anal fissure    Anemia    Anxiety    Arthritis    Depression    Glaucoma    Hearing impairment    History of bleeding ulcers    Hyperlipidemia    Hypertension    Legally blind    Macular degeneration    Macular degeneration    Stomach ulcer    Thyroid disease    UTI (urinary tract infection)     Allergies  Allergen Reactions   Neo-Synephrine [Oxymetazoline] Other (See Comments)    hyper   Prednisone Other (See Comments)    hyper    No current facility-administered medications on file prior to encounter.   Current Outpatient Medications on File Prior to Encounter  Medication Sig Dispense Refill   cholecalciferol (VITAMIN D3) 25 MCG (1000 UNIT) tablet Take 1,000 Units by mouth daily.     COVID-19 mRNA bivalent vaccine, Pfizer, (PFIZER COVID-19 VAC BIVALENT) injection Inject into the muscle. 0.3 mL 0   COVID-19 mRNA Vac-TriS, Pfizer, (PFIZER-BIONT COVID-19 VAC-TRIS) SUSP injection Inject into the muscle. 0.3 mL 0   famotidine (PEPCID) 20 MG tablet TAKE 1 TABLET BY MOUTH EVERYDAY AT BEDTIME 90 tablet 1   fluticasone (FLONASE) 50 MCG/ACT nasal spray Place 2 sprays into both nostrils daily.  5   latanoprost (XALATAN) 0.005 % ophthalmic solution Place 1 drop into both eyes at bedtime.      losartan-hydrochlorothiazide (HYZAAR) 50-12.5 MG per tablet Take 1 tablet by mouth daily. 30 tablet 0   omeprazole (PRILOSEC) 20 MG capsule Take 20 mg by mouth daily.     simethicone (GAS-X) 80 MG chewable tablet Chew 1 tablet by mouth at bedtime.     sodium chloride (OCEAN) 0.65 % SOLN  nasal spray Place 1 spray into both nostrils as needed for congestion.     timolol (TIMOPTIC) 0.5 % ophthalmic solution Place 1 drop into both eyes every morning.  12    Physical Exam: Vitals:   06/04/22 1830 06/04/22 1935  BP: (!) 164/107 (!) 159/98  Pulse: 84 86  Resp: (!) 24 18  Temp:    SpO2: 98% 95%   She is alert and oriented x3.   No shortness of breath or chest pain Abdomen is soft and nontender.  No loss of bowel or bladder control, no rebound tenderness Significant pain and tenderness over the left groin.  No laceration or contusion.  No significant ecchymosis.  No obvious deformity of the joint.  No knee/ankle pain with palpation or range of motion. Right lower extremity: No pain with passive range of motion of the hip, knee, or ankle. Compartments in the lower extremity are soft and nontender.  2+ dorsalis pedis/posterior tibialis pulses bilaterally. 5/5 EHL/tibialis anterior/gastrocnemius strength bilaterally.  Normal sensation light touch throughout the lower extremity.  Image: CT Hip Left Wo Contrast  Result Date: 06/04/2022 CLINICAL DATA:  Left hip pain after fall. EXAM: CT OF THE LEFT HIP WITHOUT CONTRAST TECHNIQUE: Multidetector CT imaging of the left hip was performed according to the standard protocol. Multiplanar CT image reconstructions were also generated. RADIATION DOSE REDUCTION:  This exam was performed according to the departmental dose-optimization program which includes automated exposure control, adjustment of the mA and/or kV according to patient size and/or use of iterative reconstruction technique. COMPARISON:  Left hip x-rays from same day. FINDINGS: Bones/Joint/Cartilage Acute nondisplaced subcapital left femoral neck fracture. No additional fracture. No dislocation. Bilateral hip degenerative changes with chondrocalcinosis. Osteopenia. No joint effusion. Ligaments Ligaments are suboptimally evaluated by CT. Muscles and Tendons Grossly intact. Soft tissue No  fluid collection or hematoma.  No soft tissue mass. IMPRESSION: 1. Acute nondisplaced subcapital left femoral neck fracture. Electronically Signed   By: William T Derry M.D.   On: 06/04/2022 17:30   DG Hip Unilat W or Wo Pelvis 2-3 Views Left  Result Date: 06/04/2022 CLINICAL DATA:  08/02/2010 EXAM: DG HIP (WITH OR WITHOUT PELVIS) 2-3V LEFT COMPARISON:  08/02/2010 FINDINGS: There is no evidence of hip fracture or dislocation. There is no evidence of arthropathy or other focal bone abnormality. IMPRESSION: Negative. Electronically Signed   By: Taylor  Stroud M.D.   On: 06/04/2022 16:48    A/P: Dana Blake is a very pleasant 88-year-old man who had a mechanical fall earlier this evening and had significant left hip pain.  She has been unable to ambulate or bear weight.  She was brought to the emergency room and x-rays were done and she was noted to have a femoral neck fracture.  As result orthopedic consultation was requested.  Patient's clinical exam is consistent with a hip fracture.  She is neurovascularly intact with no signs or symptoms of a compartment syndrome.  Plan will be to admit to the medical service.  I will contact my partner for definitive fracture fixation.  

## 2022-06-04 NOTE — Assessment & Plan Note (Addendum)
-   management as per orthopedics,  plan to operate   in  a.m.    Keep nothing by mouth post midnight. Patient  not on anticoagulation or antiplatelet agents   Patient at baseline  able to walk a flight of stairs or 100 feet    Patient denies any chest pain or shortness of breath currently and/or with exertion,   ECG ordered unless significantly abnormal with hold off on further cardiac work up   no known history of coronary artery disease,  COPD  Liver failure  CKD  Given advanced age patient is at least moderate  risk  which has been discussed with family but at this point no furthther cardiac workup is indicated.

## 2022-06-04 NOTE — ED Triage Notes (Addendum)
BIBA Per EMS: Pt coming from [facility] w/ c/o mechanical fall today. Pt c/o left hip pain. Left arm & shoulder pain as well.  No rotation or shortening.  Denies thinners, denies LOC A&O x 4  192mcg fentanyl given en route  20 G

## 2022-06-04 NOTE — Assessment & Plan Note (Signed)
Continue losartan at 50 mg po qday

## 2022-06-04 NOTE — Assessment & Plan Note (Signed)
Continue Pepcid 2o mg po qday

## 2022-06-04 NOTE — ED Provider Notes (Signed)
[place] DEPT Provider Note   CSN: [id number] Arrival date & time: 06/04/22  1502     History  Chief Complaint  Patient presents with   Fall   Hip Pain    Dana Blake is a 88 y.o. female.  Patient with a history of hypertension and depression.  She states she fell and hurt her left hip.  She did not hit her head.  Patient is alert.  The history is provided by the patient and medical records. No language interpreter was used.  Fall This is a new problem. The current episode started 12 to 24 hours ago. The problem occurs rarely. The problem has been resolved. Pertinent negatives include no chest pain, no abdominal pain and no headaches. Nothing aggravates the symptoms. Nothing relieves the symptoms. She has tried nothing for the symptoms.  Hip Pain Pertinent negatives include no chest pain, no abdominal pain and no headaches.       Home Medications Prior to Admission medications   Medication Sig Start Date End Date Taking? Authorizing Provider  cholecalciferol (VITAMIN D3) 25 MCG (1000 UNIT) tablet Take 1,000 Units by mouth daily.    [provider]  COVID-19 mRNA bivalent vaccine, Pfizer, (PFIZER COVID-19 VAC BIVALENT) injection Inject into the muscle. 10/18/21   Carlyle Basques, MD  COVID-19 mRNA Vac-TriS, Pfizer, (PFIZER-BIONT COVID-19 VAC-TRIS) SUSP injection Inject into the muscle. 01/24/21   Carlyle Basques, MD  famotidine (PEPCID) 20 MG tablet TAKE 1 TABLET BY MOUTH EVERYDAY AT BEDTIME 03/24/20   Noralyn Pick, NP  fluticasone (FLONASE) 50 MCG/ACT nasal spray Place 2 sprays into both nostrils daily. 12/30/17   [provider]  latanoprost (XALATAN) 0.005 % ophthalmic solution Place 1 drop into both eyes at bedtime.     [provider]  losartan-hydrochlorothiazide (HYZAAR) 50-12.5 MG per tablet Take 1 tablet by mouth daily. 10/24/14   Tanda Rockers, MD  omeprazole (PRILOSEC) 20 MG capsule Take 20 mg by  mouth daily.    [provider]  simethicone (GAS-X) 80 MG chewable tablet Chew 1 tablet by mouth at bedtime.    [provider]  sodium chloride (OCEAN) 0.65 % SOLN nasal spray Place 1 spray into both nostrils as needed for congestion.    [provider]  timolol (TIMOPTIC) 0.5 % ophthalmic solution Place 1 drop into both eyes every morning. 07/26/15   [provider]      Allergies    Neo-synephrine [oxymetazoline] and Prednisone    Review of Systems   Review of Systems  Constitutional:  Negative for appetite change and fatigue.  HENT:  Negative for congestion, ear discharge and sinus pressure.   Eyes:  Negative for discharge.  Respiratory:  Negative for cough.   Cardiovascular:  Negative for chest pain.  Gastrointestinal:  Negative for abdominal pain and diarrhea.  Genitourinary:  Negative for frequency and hematuria.  Musculoskeletal:  Negative for back pain.       Left hip pain  Skin:  Negative for rash.  Neurological:  Negative for seizures and headaches.  Psychiatric/Behavioral:  Negative for hallucinations.     Physical Exam Updated Vital Signs BP (!) 180/76 (BP Location: Right Arm)   Pulse 79   Temp 98 F (36.7 C) (Oral)   Resp 16   SpO2 97%  Physical Exam Vitals and nursing note reviewed.  Constitutional:      Appearance: She is well-developed.  HENT:     Head: Normocephalic.     Nose:  Nose normal.  Eyes:     General: No scleral icterus.    Conjunctiva/sclera: Conjunctivae normal.  Neck:     Thyroid: No thyromegaly.  Cardiovascular:     Rate and Rhythm: Normal rate and regular rhythm.     Heart sounds: No murmur heard.    No friction rub. No gallop.  Pulmonary:     Breath sounds: No stridor. No wheezing or rales.  Chest:     Chest wall: No tenderness.  Abdominal:     General: There is no distension.     Tenderness: There is no abdominal tenderness. There is no rebound.  Musculoskeletal:     Cervical back: Neck  supple.     Comments: Moderate tenderness to left hip  Lymphadenopathy:     Cervical: No cervical adenopathy.  Skin:    Findings: No erythema or rash.  Neurological:     Mental Status: She is alert and oriented to person, place, and time.     Motor: No abnormal muscle tone.     Coordination: Coordination normal.  Psychiatric:        Behavior: Behavior normal.     ED Results / Procedures / Treatments   Labs (all labs ordered are listed, but only abnormal results are displayed) Labs Reviewed  CBC WITH DIFFERENTIAL/PLATELET  BASIC METABOLIC PANEL    EKG None  Radiology No results found.  Procedures Procedures    Medications Ordered in ED Medications - No data to display  ED Course/ Medical Decision Making/ A&P                           Medical Decision Making Amount and/or Complexity of Data Reviewed Labs: ordered. Radiology: ordered.  Risk Prescription drug management. Decision regarding hospitalization.   Patient with fall and left hip pain.  Most likely fractured left hip.  X-rays pending    disposition will be determined by my colleague        Final Clinical Impression(s) / ED Diagnoses Final diagnoses:  None    Rx / DC Orders ED Discharge Orders     None         Bethann Berkshire, MD 06/06/22 1708

## 2022-06-04 NOTE — Subjective & Objective (Signed)
Presents from [facility] with mechanical fall today falling on her left hip resulting in pain in left arm left shoulder and left hip.  Not on blood thinners did not hit her head no loss of consciousness EMS was called administered 100 mcg of fentanyl

## 2022-06-04 NOTE — Consult Note (Signed)
Chief Complaint: Left hip fracture  History: Dana Blake is a very pleasant 88 year old woman who had a mechanical fall and has been unable to ambulate.  Patient was brought to the emergency room for further work-up and treatment.  Imaging studies demonstrated a left renal neck fracture.  Patient denies any recent fevers, chills, weight loss, loss of consciousness.  Past Medical History:  Diagnosis Date   Anal fissure    Anemia    Anxiety    Arthritis    Depression    Glaucoma    Hearing impairment    History of bleeding ulcers    Hyperlipidemia    Hypertension    Legally blind    Macular degeneration    Macular degeneration    Stomach ulcer    Thyroid disease    UTI (urinary tract infection)     Allergies  Allergen Reactions   Neo-Synephrine [Oxymetazoline] Other (See Comments)    hyper   Prednisone Other (See Comments)    hyper    No current facility-administered medications on file prior to encounter.   Current Outpatient Medications on File Prior to Encounter  Medication Sig Dispense Refill   cholecalciferol (VITAMIN D3) 25 MCG (1000 UNIT) tablet Take 1,000 Units by mouth daily.     COVID-19 mRNA bivalent vaccine, Pfizer, (PFIZER COVID-19 VAC BIVALENT) injection Inject into the muscle. 0.3 mL 0   COVID-19 mRNA Vac-TriS, Pfizer, (PFIZER-BIONT COVID-19 VAC-TRIS) SUSP injection Inject into the muscle. 0.3 mL 0   famotidine (PEPCID) 20 MG tablet TAKE 1 TABLET BY MOUTH EVERYDAY AT BEDTIME 90 tablet 1   fluticasone (FLONASE) 50 MCG/ACT nasal spray Place 2 sprays into both nostrils daily.  5   latanoprost (XALATAN) 0.005 % ophthalmic solution Place 1 drop into both eyes at bedtime.      losartan-hydrochlorothiazide (HYZAAR) 50-12.5 MG per tablet Take 1 tablet by mouth daily. 30 tablet 0   omeprazole (PRILOSEC) 20 MG capsule Take 20 mg by mouth daily.     simethicone (GAS-X) 80 MG chewable tablet Chew 1 tablet by mouth at bedtime.     sodium chloride (OCEAN) 0.65 % SOLN  nasal spray Place 1 spray into both nostrils as needed for congestion.     timolol (TIMOPTIC) 0.5 % ophthalmic solution Place 1 drop into both eyes every morning.  12    Physical Exam: Vitals:   06/04/22 1830 06/04/22 1935  BP: (!) 164/107 (!) 159/98  Pulse: 84 86  Resp: (!) 24 18  Temp:    SpO2: 98% 95%   She is alert and oriented x3.   No shortness of breath or chest pain Abdomen is soft and nontender.  No loss of bowel or bladder control, no rebound tenderness Significant pain and tenderness over the left groin.  No laceration or contusion.  No significant ecchymosis.  No obvious deformity of the joint.  No knee/ankle pain with palpation or range of motion. Right lower extremity: No pain with passive range of motion of the hip, knee, or ankle. Compartments in the lower extremity are soft and nontender.  2+ dorsalis pedis/posterior tibialis pulses bilaterally. 5/5 EHL/tibialis anterior/gastrocnemius strength bilaterally.  Normal sensation light touch throughout the lower extremity.  Image: CT Hip Left Wo Contrast  Result Date: 06/04/2022 CLINICAL DATA:  Left hip pain after fall. EXAM: CT OF THE LEFT HIP WITHOUT CONTRAST TECHNIQUE: Multidetector CT imaging of the left hip was performed according to the standard protocol. Multiplanar CT image reconstructions were also generated. RADIATION DOSE REDUCTION:  This exam was performed according to the departmental dose-optimization program which includes automated exposure control, adjustment of the mA and/or kV according to patient size and/or use of iterative reconstruction technique. COMPARISON:  Left hip x-rays from same day. FINDINGS: Bones/Joint/Cartilage Acute nondisplaced subcapital left femoral neck fracture. No additional fracture. No dislocation. Bilateral hip degenerative changes with chondrocalcinosis. Osteopenia. No joint effusion. Ligaments Ligaments are suboptimally evaluated by CT. Muscles and Tendons Grossly intact. Soft tissue No  fluid collection or hematoma.  No soft tissue mass. IMPRESSION: 1. Acute nondisplaced subcapital left femoral neck fracture. Electronically Signed   By: Titus Dubin M.D.   On: 06/04/2022 17:30   DG Hip Unilat W or Wo Pelvis 2-3 Views Left  Result Date: 06/04/2022 CLINICAL DATA:  08/02/2010 EXAM: DG HIP (WITH OR WITHOUT PELVIS) 2-3V LEFT COMPARISON:  08/02/2010 FINDINGS: There is no evidence of hip fracture or dislocation. There is no evidence of arthropathy or other focal bone abnormality. IMPRESSION: Negative. Electronically Signed   By: Kerby Moors M.D.   On: 06/04/2022 16:48    A/P: Dana Blake is a very pleasant 88 year old man who had a mechanical fall earlier this evening and had significant left hip pain.  She has been unable to ambulate or bear weight.  She was brought to the emergency room and x-rays were done and she was noted to have a femoral neck fracture.  As result orthopedic consultation was requested.  Patient's clinical exam is consistent with a hip fracture.  She is neurovascularly intact with no signs or symptoms of a compartment syndrome.  Plan will be to admit to the medical service.  I will contact my partner for definitive fracture fixation.

## 2022-06-04 NOTE — ED Provider Notes (Signed)
Care of patient assumed at signout.  At signout the patient was awaiting x-ray results, and I reviewed these, with concern for left hip fracture, I ordered CT scan.  This did demonstrate a femoral neck fracture.  I discussed this with the patient and multiple family members subsequently with Dr. Rolena Infante from our orthopedic team.  Patient admitted to the internal medicine team for anticipated surgical repair.   Carmin Muskrat, MD 06/04/22 [phone]

## 2022-06-04 NOTE — H&P (Addendum)
[name] DOB: [birth year] DOA: 06/04/2022     PCP: [facility] [place]   Outpatient Specialists:   none  Patient arrived to ER on 06/04/22 at 1502 Referred by Attending Gerhard Munch, MD   Patient coming from:     From facility [facility] [place]  Chief Complaint:   Chief Complaint  Patient presents with   Fall   Hip Pain    HPI: Dana Blake is a 88 y.o. female with medical history significant of hypertension anemia, depression    Presented with   fall Presents from [facility] with mechanical fall today falling on her left hip resulting in pain in left arm left shoulder and left hip.  Not on blood thinners did not hit her head no loss of consciousness EMS was called administered 100 mcg of fentanyl   At baseline not needing  walker no Hx of CAD No CP or SOB Could walk a flight of stair, she walks a lot No exertional CP no SOB Reports she is unsure how she fell, she was walking and just fell down   Regarding pertinent Chronic problems:       HTN on Hyzaar   chronic CHF diastolic - last echo 2016 EF  44-81%      Lab Results  Component Value Date   CREATININE 0.57 06/04/2022   CREATININE 0.66 03/16/2022   CREATININE 0.70 12/29/2019     While in ER:  Plain images show no evidence of hip fracture  CT hip . Acute nondisplaced subcapital left femoral neck fracture.     CXR -  NON acute  Following Medications were ordered in ER: Medications  HYDROmorphone (DILAUDID) injection 0.5 mg (0.5 mg Intravenous Given 06/04/22 1638)  fentaNYL (SUBLIMAZE) injection 25 mcg (25 mcg Intravenous Given 06/04/22 1749)    _______________________________________________________ ER Provider Called:    Dr. Naaman Plummer They Recommend admit to medicine npo pmn Will see in AM     ED Triage Vitals  Enc Vitals Group     BP 06/04/22 1508 (!) 180/76     Pulse Rate 06/04/22 1508 79     Resp 06/04/22 1508 16     Temp 06/04/22  1508 98 F (36.7 C)     Temp Source 06/04/22 1508 Oral     SpO2 06/04/22 1508 97 %     Weight --      Height --      Head Circumference --      Peak Flow --      Pain Score 06/04/22 1515 8     Pain Loc --      Pain Edu? --      Excl. in GC? --   TMAX(24)@     _________________________________________ Significant initial  Findings: Abnormal Labs Reviewed  BASIC METABOLIC PANEL - Abnormal; Notable for the following components:      Result Value   Glucose, Bld 102 (*)    Calcium 8.8 (*)    All other components within normal limits     ECG:   HR 96 SR Non-ischemic        The recent clinical data is shown below. Vitals:   06/04/22 1645 06/04/22 1700 06/04/22 1747 06/04/22 1830  BP: (!) 182/70 (!) 182/120 (!) 158/82 (!) 164/107  Pulse: 96 82 82 84  Resp:  16 18 (!) 24  Temp:   97.9 F (36.6 C)   TempSrc:      SpO2: Marland Kitchen)  88% 98% 96% 98%    WBC     Component Value Date/Time   WBC 9.0 06/04/2022 1530   LYMPHSABS 1.4 06/04/2022 1530   MONOABS 0.7 06/04/2022 1530   EOSABS 0.1 06/04/2022 1530   BASOSABS 0.0 06/04/2022 1530       _____________________________________ Hospitalist was called for admission for left femoral neck frx   The following Work up has been ordered so far:  Orders Placed This Encounter  Procedures   DG Hip Unilat W or Wo Pelvis 2-3 Views Left   CT Hip Left Wo Contrast   CBC with Differential   Basic metabolic panel   Consult to orthopedic surgery   Consult to hospitalist     OTHER Significant initial  Findings:  labs showing:    Recent Labs  Lab 06/04/22 1530  NA 139  K 4.4  CO2 26  GLUCOSE 102*  BUN 16  CREATININE 0.57  CALCIUM 8.8*    Cr   stable,   Lab Results  Component Value Date   CREATININE 0.57 06/04/2022   CREATININE 0.66 03/16/2022   CREATININE 0.70 12/29/2019    No results for input(s): "AST", "ALT", "ALKPHOS", "BILITOT", "PROT", "ALBUMIN" in the last 168 hours. Lab Results  Component Value Date   CALCIUM  8.8 (L) 06/04/2022        Plt: Lab Results  Component Value Date   PLT 313 06/04/2022     COVID-19 Labs  No results for input(s): "DDIMER", "FERRITIN", "LDH", "CRP" in the last 72 hours.  Lab Results  Component Value Date   SARSCOV2NAA NEGATIVE 03/16/2022        Recent Labs  Lab 06/04/22 1530  WBC 9.0  NEUTROABS 6.7  HGB 12.8  HCT 39.6  MCV 88.6  PLT 313    HG/HCT  stable,      Component Value Date/Time   HGB 12.8 06/04/2022 1530   HCT 39.6 06/04/2022 1530   MCV 88.6 06/04/2022 1530          Cultures: No results found for: "SDES", "SPECREQUEST", "CULT", "REPTSTATUS"   Radiological Exams on Admission: CT Hip Left Wo Contrast  Result Date: 06/04/2022 CLINICAL DATA:  Left hip pain after fall. EXAM: CT OF THE LEFT HIP WITHOUT CONTRAST TECHNIQUE: Multidetector CT imaging of the left hip was performed according to the standard protocol. Multiplanar CT image reconstructions were also generated. RADIATION DOSE REDUCTION: This exam was performed according to the departmental dose-optimization program which includes automated exposure control, adjustment of the mA and/or kV according to patient size and/or use of iterative reconstruction technique. COMPARISON:  Left hip x-rays from same day. FINDINGS: Bones/Joint/Cartilage Acute nondisplaced subcapital left femoral neck fracture. No additional fracture. No dislocation. Bilateral hip degenerative changes with chondrocalcinosis. Osteopenia. No joint effusion. Ligaments Ligaments are suboptimally evaluated by CT. Muscles and Tendons Grossly intact. Soft tissue No fluid collection or hematoma.  No soft tissue mass. IMPRESSION: 1. Acute nondisplaced subcapital left femoral neck fracture. Electronically Signed   By: Titus Dubin M.D.   On: 06/04/2022 17:30   DG Hip Unilat W or Wo Pelvis 2-3 Views Left  Result Date: 06/04/2022 CLINICAL DATA:  08/02/2010 EXAM: DG HIP (WITH OR WITHOUT PELVIS) 2-3V LEFT COMPARISON:  08/02/2010  FINDINGS: There is no evidence of hip fracture or dislocation. There is no evidence of arthropathy or other focal bone abnormality. IMPRESSION: Negative. Electronically Signed   By: Kerby Moors M.D.   On: 06/04/2022 16:48   _______________________________________________________________________________________________________ Latest  Blood pressure (!) 164/107,  pulse 84, temperature 97.9 F (36.6 C), resp. rate (!) 24, SpO2 98 %.   Vitals  labs and radiology finding personally reviewed  Review of Systems:    Pertinent positives include: fall  Constitutional:  No weight loss, night sweats, Fevers, chills, fatigue, weight loss  HEENT:  No headaches, Difficulty swallowing,Tooth/dental problems,Sore throat,  No sneezing, itching, ear ache, nasal congestion, post nasal drip,  Cardio-vascular:  No chest pain, Orthopnea, PND, anasarca, dizziness, palpitations.no Bilateral lower extremity swelling  GI:  No heartburn, indigestion, abdominal pain, nausea, vomiting, diarrhea, change in bowel habits, loss of appetite, melena, blood in stool, hematemesis Resp:  no shortness of breath at rest. No dyspnea on exertion, No excess mucus, no productive cough, No non-productive cough, No coughing up of blood.No change in color of mucus.No wheezing. Skin:  no rash or lesions. No jaundice GU:  no dysuria, change in color of urine, no urgency or frequency. No straining to urinate.  No flank pain.  Musculoskeletal:  No joint pain or no joint swelling. No decreased range of motion. No back pain.  Psych:  No change in mood or affect. No depression or anxiety. No memory loss.  Neuro: no localizing neurological complaints, no tingling, no weakness, no double vision, no gait abnormality, no slurred speech, no confusion  All systems reviewed and apart from [place] all are negative _______________________________________________________________________________________________ Past Medical History:   Past  Medical History:  Diagnosis Date   Anal fissure    Anemia    Anxiety    Arthritis    Depression    Glaucoma    Hearing impairment    History of bleeding ulcers    Hyperlipidemia    Hypertension    Legally blind    Macular degeneration    Macular degeneration    Stomach ulcer    Thyroid disease    UTI (urinary tract infection)      Past Surgical History:  Procedure Laterality Date   APPENDECTOMY     BACK SURGERY     FINGER SURGERY     Left Little Finger    Social History:  Ambulatory   independently       reports that she has quit smoking. Her smoking use included cigarettes. She has a 33.00 pack-year smoking history. She has never used smokeless tobacco. She reports current alcohol use. She reports that she does not use drugs.   Family History:    Family History  Problem Relation Age of Onset   Breast cancer Neg Hx    ______________________________________________________________________________________________ Allergies: Allergies  Allergen Reactions   Neo-Synephrine [Oxymetazoline] Other (See Comments)    hyper   Prednisone Other (See Comments)    hyper     Prior to Admission medications   Medication Sig Start Date End Date Taking? Authorizing Provider  cholecalciferol (VITAMIN D3) 25 MCG (1000 UNIT) tablet Take 1,000 Units by mouth daily.    [provider]  COVID-19 mRNA bivalent vaccine, Pfizer, (PFIZER COVID-19 VAC BIVALENT) injection Inject into the muscle. 10/18/21   Carlyle Basques, MD  COVID-19 mRNA Vac-TriS, Pfizer, (PFIZER-BIONT COVID-19 VAC-TRIS) SUSP injection Inject into the muscle. 01/24/21   Carlyle Basques, MD  famotidine (PEPCID) 20 MG tablet TAKE 1 TABLET BY MOUTH EVERYDAY AT BEDTIME 03/24/20   Noralyn Pick, NP  fluticasone (FLONASE) 50 MCG/ACT nasal spray Place 2 sprays into both nostrils daily. 12/30/17   [provider]  latanoprost (XALATAN) 0.005 % ophthalmic solution Place 1 drop into both eyes at bedtime.  [provider]  losartan-hydrochlorothiazide (HYZAAR) 50-12.5 MG per tablet Take 1 tablet by mouth daily. 10/24/14   Tanda Rockers, MD  omeprazole (PRILOSEC) 20 MG capsule Take 20 mg by mouth daily.    [provider]  simethicone (GAS-X) 80 MG chewable tablet Chew 1 tablet by mouth at bedtime.    [provider]  sodium chloride (OCEAN) 0.65 % SOLN nasal spray Place 1 spray into both nostrils as needed for congestion.    [provider]  timolol (TIMOPTIC) 0.5 % ophthalmic solution Place 1 drop into both eyes every morning. 07/26/15   [provider]    ___________________________________________________________________________________________________ Physical Exam:    06/04/2022    6:30 PM 06/04/2022    5:47 PM 06/04/2022    5:00 PM  Vitals with BMI  Systolic 403 474 259  Diastolic 563 82 875  Pulse 84 82 82     1. General:  in No  Acute distress   well  -appearing 2. Psychological: Alert and   Oriented 3. Head/ENT:   Dry Mucous Membranes                          Head Non traumatic, neck supple                           Poor Dentition 4. SKIN:   decreased Skin turgor,  Skin clean Dry and intact no rash 5. Heart: Regular rate and rhythm no  Murmur, no Rub or gallop 6. Lungs: , no wheezes or crackles   7. Abdomen: Soft,  non-tender, Non distended bowel sounds present 8. Lower extremities: no clubbing, cyanosis, no  edema 9. Neurologically Grossly intact, moving all 4 extremities equally   10. MSK: Normal range of motion Except in left leg    Chart has been reviewed  ______________________________________________________________________________________________  Assessment/Plan 88 y.o. female with medical history significant of hypertension anemia, depression    Admitted for left femoral neck fracture   Present on Admission:  Closed left hip fracture, initial encounter ([place])  Essential hypertension  GERD (gastroesophageal  reflux disease)     Closed left hip fracture, initial encounter ([place])  - management as per orthopedics,  plan to operate   in  a.m.    Keep nothing by mouth post midnight. Patient  not on anticoagulation or antiplatelet agents   Patient at baseline  able to walk a flight of stairs or 100 feet    Patient denies any chest pain or shortness of breath currently and/or with exertion,   ECG ordered unless significantly abnormal with hold off on further cardiac work up   no known history of coronary artery disease,  COPD  Liver failure  CKD  Given advanced age patient is at least moderate  risk  which has been discussed with family but at this point no furthther cardiac workup is indicated.      Essential hypertension Continue losartan at 50 mg po qday  GERD (gastroesophageal reflux disease) Continue Pepcid 2o mg po qday    Other plan as per orders.  DVT prophylaxis:  SCD      Code Status:    Code Status: Not on file FULL CODE   as per patient   I had personally discussed CODE STATUS with patient      Family Communication:   Family   at  Bedside  plan of care was discussed   with  Son,   Disposition Plan:  likely will need placement for rehabilitation                             Following barriers for discharge:                                                        Pain controlled with PO medications                                                          Will need consultants to evaluate patient prior to discharge     Consults called: orthopedics are aware   Admission status:  ED Disposition     ED Disposition  Admit   Condition  --   [place]: [facility] [[id number]]  Level of Care: Med-Surg [16]  May admit patient to [facility] or [facility] if equivalent level of care is available:: No  Covid Evaluation: Asymptomatic - no recent exposure (last 10 days) testing not required  Diagnosis: [facility] displaced [id number]  Admitting Physician: [place], [place]  Attending Physician: Toy Baker [id number]  Certification:: I certify this patient will need inpatient services for at least 2 midnights  Estimated Length of Stay: 2           inpatient     I Expect 2 midnight stay secondary to severity of patient's current illness need for inpatient interventions justified by the following:   Severe lab/radiological/exam abnormalities including:    Left hip frx    That are currently affecting medical management.   I expect  patient to be hospitalized for 2 midnights requiring inpatient medical care.  Patient is at high risk for adverse outcome (such as loss of life or disability) if not treated.  Indication for inpatient stay as follows:    severe pain requiring acute inpatient management,  inability to maintain oral hydration    Need for operative/procedural  intervention     Need for  IV fluids,  IV pain medications, I    Level of care    medical floor      Toy Baker 06/04/2022, 8:36 PM    [facility]     after 2 AM please page floor coverage PA If 7AM-7PM, please contact the day team taking care of the patient using [email or web address]   Patient was evaluated in the context of the global COVID-19 pandemic, which necessitated consideration that the patient might be at risk for infection with the SARS-CoV-2 virus that causes COVID-19. Institutional protocols and algorithms that pertain to the evaluation of patients at risk for COVID-19 are in a state of rapid change based on information released by regulatory bodies including the CDC and federal and state organizations. These policies and algorithms were followed during the patient's care.

## 2022-06-04 NOTE — ED Notes (Signed)
Pt destat to 84. O2 increased to 3L

## 2022-06-05 ENCOUNTER — Encounter (HOSPITAL_COMMUNITY): Payer: Self-pay | Admitting: Internal Medicine

## 2022-06-05 ENCOUNTER — Inpatient Hospital Stay (HOSPITAL_COMMUNITY): Payer: Medicare Other

## 2022-06-05 ENCOUNTER — Encounter (HOSPITAL_COMMUNITY)
Admission: EM | Disposition: A | Discharge status: Skilled Nursing Facility | Payer: Self-pay | Source: Skilled Nursing Facility | Attending: Internal Medicine

## 2022-06-05 ENCOUNTER — Inpatient Hospital Stay (HOSPITAL_COMMUNITY): Payer: Medicare Other | Admitting: Anesthesiology

## 2022-06-05 ENCOUNTER — Other Ambulatory Visit: Payer: Self-pay

## 2022-06-05 DIAGNOSIS — S72002A Fracture of unspecified part of neck of left femur, initial encounter for closed fracture: Secondary | ICD-10-CM | POA: Diagnosis present

## 2022-06-05 DIAGNOSIS — J449 Chronic obstructive pulmonary disease, unspecified: Secondary | ICD-10-CM

## 2022-06-05 DIAGNOSIS — I10 Essential (primary) hypertension: Secondary | ICD-10-CM

## 2022-06-05 DIAGNOSIS — Z87891 Personal history of nicotine dependence: Secondary | ICD-10-CM

## 2022-06-05 DIAGNOSIS — R9431 Abnormal electrocardiogram [ECG] [EKG]: Secondary | ICD-10-CM | POA: Diagnosis not present

## 2022-06-05 HISTORY — PX: TOTAL HIP ARTHROPLASTY: SHX124

## 2022-06-05 LAB — HEPATITIS PANEL, ACUTE
HCV Ab: NONREACTIVE
Hep A IgM: NONREACTIVE
Hep B C IgM: NONREACTIVE
Hepatitis B Surface Ag: NONREACTIVE

## 2022-06-05 LAB — TSH: TSH: 1.178 u[IU]/mL (ref 0.350–4.500)

## 2022-06-05 LAB — TYPE AND SCREEN
ABO/RH(D): A POS
Antibody Screen: NEGATIVE

## 2022-06-05 LAB — ABO/RH: ABO/RH(D): A POS

## 2022-06-05 LAB — VITAMIN D 25 HYDROXY (VIT D DEFICIENCY, FRACTURES): Vit D, 25-Hydroxy: 25.68 ng/mL — ABNORMAL LOW (ref 30–100)

## 2022-06-05 SURGERY — ARTHROPLASTY, HIP, TOTAL, ANTERIOR APPROACH
Anesthesia: Spinal | Site: Hip | Laterality: Left

## 2022-06-05 MED ORDER — ACETAMINOPHEN 500 MG PO TABS
500.0000 mg | ORAL_TABLET | Freq: Four times a day (QID) | ORAL | Status: AC
  Administered 2022-06-05 – 2022-06-06 (×2): 500 mg via ORAL
  Filled 2022-06-05 (×2): qty 1

## 2022-06-05 MED ORDER — BISACODYL 10 MG RE SUPP: 10.0000 mg | Freq: Every day | RECTAL | Status: DC | PRN

## 2022-06-05 MED ORDER — BUPIVACAINE-EPINEPHRINE (PF) 0.25% -1:200000 IJ SOLN
INTRAMUSCULAR | Status: AC
  Filled 2022-06-05: qty 30

## 2022-06-05 MED ORDER — OXYCODONE HCL 5 MG/5ML PO SOLN: 5.0000 mg | Freq: Once | ORAL | Status: DC | PRN

## 2022-06-05 MED ORDER — POVIDONE-IODINE 10 % EX SWAB: 2.0000 | Freq: Once | CUTANEOUS | Status: DC

## 2022-06-05 MED ORDER — METOCLOPRAMIDE HCL 5 MG PO TABS: 5.0000 mg | ORAL_TABLET | Freq: Three times a day (TID) | ORAL | Status: DC | PRN

## 2022-06-05 MED ORDER — KETOROLAC TROMETHAMINE 30 MG/ML IJ SOLN
INTRAMUSCULAR | Status: AC
  Filled 2022-06-05: qty 1

## 2022-06-05 MED ORDER — METHOCARBAMOL 1000 MG/10ML IJ SOLN: 500.0000 mg | Freq: Four times a day (QID) | INTRAVENOUS | Status: DC | PRN

## 2022-06-05 MED ORDER — ONDANSETRON HCL 4 MG/2ML IJ SOLN
INTRAMUSCULAR | Status: DC | PRN
  Administered 2022-06-05: 4 mg via INTRAVENOUS

## 2022-06-05 MED ORDER — PROPOFOL 10 MG/ML IV BOLUS
INTRAVENOUS | Status: AC
  Filled 2022-06-05: qty 20

## 2022-06-05 MED ORDER — CHLORHEXIDINE GLUCONATE 0.12 % MT SOLN
15.0000 mL | Freq: Once | OROMUCOSAL | Status: AC
  Administered 2022-06-05: 15 mL via OROMUCOSAL

## 2022-06-05 MED ORDER — PHENYLEPHRINE HCL (PRESSORS) 10 MG/ML IV SOLN
INTRAVENOUS | Status: AC
  Filled 2022-06-05: qty 1

## 2022-06-05 MED ORDER — SODIUM CHLORIDE 0.9 % IR SOLN
Status: DC | PRN
  Administered 2022-06-05: 3000 mL

## 2022-06-05 MED ORDER — PHENYLEPHRINE 80 MCG/ML (10ML) SYRINGE FOR IV PUSH (FOR BLOOD PRESSURE SUPPORT)
PREFILLED_SYRINGE | INTRAVENOUS | Status: DC | PRN
  Administered 2022-06-05: 160 ug via INTRAVENOUS
  Administered 2022-06-05: 240 ug via INTRAVENOUS
  Administered 2022-06-05: 160 ug via INTRAVENOUS

## 2022-06-05 MED ORDER — METHOCARBAMOL 500 MG PO TABS
500.0000 mg | ORAL_TABLET | Freq: Four times a day (QID) | ORAL | Status: DC | PRN
  Administered 2022-06-06 – 2022-06-09 (×6): 500 mg via ORAL
  Filled 2022-06-05 (×6): qty 1

## 2022-06-05 MED ORDER — PROPOFOL 10 MG/ML IV BOLUS
INTRAVENOUS | Status: DC | PRN
  Administered 2022-06-05 (×3): 10 mg via INTRAVENOUS

## 2022-06-05 MED ORDER — FENTANYL CITRATE (PF) 100 MCG/2ML IJ SOLN
INTRAMUSCULAR | Status: AC
  Filled 2022-06-05: qty 2

## 2022-06-05 MED ORDER — HYDROMORPHONE HCL 1 MG/ML IJ SOLN
0.5000 mg | INTRAMUSCULAR | Status: DC | PRN
  Administered 2022-06-05 – 2022-06-06 (×4): 0.5 mg via INTRAVENOUS
  Filled 2022-06-05 (×2): qty 1
  Filled 2022-06-05: qty 0.5
  Filled 2022-06-05: qty 1

## 2022-06-05 MED ORDER — PROPOFOL 500 MG/50ML IV EMUL
INTRAVENOUS | Status: DC | PRN
  Administered 2022-06-05: 40 ug/kg/min via INTRAVENOUS

## 2022-06-05 MED ORDER — SODIUM CHLORIDE (PF) 0.9 % IJ SOLN
INTRAMUSCULAR | Status: AC
  Filled 2022-06-05: qty 50

## 2022-06-05 MED ORDER — CEFAZOLIN IN SODIUM CHLORIDE 3-0.9 GM/100ML-% IV SOLN: 3.0000 g | INTRAVENOUS | Status: DC

## 2022-06-05 MED ORDER — POLYETHYLENE GLYCOL 3350 17 G PO PACK
17.0000 g | PACK | Freq: Every day | ORAL | Status: DC | PRN
  Administered 2022-06-06 – 2022-06-08 (×2): 17 g via ORAL
  Filled 2022-06-05: qty 1

## 2022-06-05 MED ORDER — PHENYLEPHRINE HCL-NACL 20-0.9 MG/250ML-% IV SOLN
INTRAVENOUS | Status: DC | PRN
  Administered 2022-06-05: 50 ug/min via INTRAVENOUS

## 2022-06-05 MED ORDER — SODIUM CHLORIDE 0.9 % IR SOLN
Status: DC | PRN
  Administered 2022-06-05: 1000 mL

## 2022-06-05 MED ORDER — SODIUM CHLORIDE (PF) 0.9 % IJ SOLN
INTRAMUSCULAR | Status: DC | PRN
  Administered 2022-06-05: 30 mL

## 2022-06-05 MED ORDER — CEFAZOLIN SODIUM-DEXTROSE 2-4 GM/100ML-% IV SOLN
2.0000 g | INTRAVENOUS | Status: AC
  Administered 2022-06-05: 2 g via INTRAVENOUS
  Filled 2022-06-05: qty 100

## 2022-06-05 MED ORDER — MORPHINE SULFATE (PF) 2 MG/ML IV SOLN: 0.5000 mg | INTRAVENOUS | Status: DC | PRN

## 2022-06-05 MED ORDER — ONDANSETRON HCL 4 MG/2ML IJ SOLN: 4.0000 mg | Freq: Four times a day (QID) | INTRAMUSCULAR | Status: DC | PRN

## 2022-06-05 MED ORDER — KETOROLAC TROMETHAMINE 30 MG/ML IJ SOLN
INTRAMUSCULAR | Status: DC | PRN
  Administered 2022-06-05: 30 mg

## 2022-06-05 MED ORDER — PANTOPRAZOLE SODIUM 40 MG PO TBEC: 40.0000 mg | DELAYED_RELEASE_TABLET | Freq: Every day | ORAL | Status: DC

## 2022-06-05 MED ORDER — ACETAMINOPHEN 325 MG PO TABS
325.0000 mg | ORAL_TABLET | Freq: Four times a day (QID) | ORAL | Status: DC | PRN
  Administered 2022-06-08 (×2): 650 mg via ORAL
  Filled 2022-06-05 (×2): qty 2

## 2022-06-05 MED ORDER — ORAL CARE MOUTH RINSE: 15.0000 mL | Freq: Once | OROMUCOSAL | Status: AC

## 2022-06-05 MED ORDER — LATANOPROST 0.005 % OP SOLN
1.0000 [drp] | Freq: Every day | OPHTHALMIC | Status: DC
  Administered 2022-06-06 – 2022-06-08 (×4): 1 [drp] via OPHTHALMIC
  Filled 2022-06-05: qty 2.5

## 2022-06-05 MED ORDER — CEFAZOLIN SODIUM-DEXTROSE 2-4 GM/100ML-% IV SOLN
2.0000 g | Freq: Four times a day (QID) | INTRAVENOUS | Status: AC
  Administered 2022-06-05 – 2022-06-06 (×2): 2 g via INTRAVENOUS
  Filled 2022-06-05 (×2): qty 100

## 2022-06-05 MED ORDER — HYDROCODONE-ACETAMINOPHEN 5-325 MG PO TABS
1.0000 | ORAL_TABLET | Freq: Four times a day (QID) | ORAL | Status: DC | PRN
  Administered 2022-06-05: 2 via ORAL
  Filled 2022-06-05: qty 2

## 2022-06-05 MED ORDER — DOCUSATE SODIUM 100 MG PO CAPS
100.0000 mg | ORAL_CAPSULE | Freq: Two times a day (BID) | ORAL | Status: DC
  Administered 2022-06-05 – 2022-06-09 (×8): 100 mg via ORAL
  Filled 2022-06-05 (×8): qty 1

## 2022-06-05 MED ORDER — TRANEXAMIC ACID-NACL 1000-0.7 MG/100ML-% IV SOLN
1000.0000 mg | INTRAVENOUS | Status: AC
  Administered 2022-06-05: 1000 mg via INTRAVENOUS
  Filled 2022-06-05: qty 100

## 2022-06-05 MED ORDER — PROPOFOL 500 MG/50ML IV EMUL
INTRAVENOUS | Status: AC
  Filled 2022-06-05: qty 50

## 2022-06-05 MED ORDER — MENTHOL 3 MG MT LOZG: 1.0000 | LOZENGE | OROMUCOSAL | Status: DC | PRN

## 2022-06-05 MED ORDER — ALUM & MAG HYDROXIDE-SIMETH 200-200-20 MG/5ML PO SUSP: 30.0000 mL | ORAL | Status: DC | PRN

## 2022-06-05 MED ORDER — ONDANSETRON HCL 4 MG/2ML IJ SOLN
4.0000 mg | Freq: Four times a day (QID) | INTRAMUSCULAR | Status: DC | PRN
  Administered 2022-06-06: 4 mg via INTRAVENOUS
  Filled 2022-06-05: qty 2

## 2022-06-05 MED ORDER — FAMOTIDINE 20 MG PO TABS
20.0000 mg | ORAL_TABLET | Freq: Every day | ORAL | Status: DC
  Administered 2022-06-05 – 2022-06-08 (×4): 20 mg via ORAL
  Filled 2022-06-05 (×5): qty 1

## 2022-06-05 MED ORDER — METHOCARBAMOL 500 MG PO TABS
500.0000 mg | ORAL_TABLET | Freq: Four times a day (QID) | ORAL | Status: DC | PRN
  Administered 2022-06-05: 500 mg via ORAL
  Filled 2022-06-05: qty 1

## 2022-06-05 MED ORDER — ISOPROPYL ALCOHOL 70 % SOLN
Status: DC | PRN
  Administered 2022-06-05: 1 via TOPICAL

## 2022-06-05 MED ORDER — PANTOPRAZOLE SODIUM 40 MG PO TBEC
40.0000 mg | DELAYED_RELEASE_TABLET | Freq: Every day | ORAL | Status: DC
  Administered 2022-06-06 – 2022-06-09 (×4): 40 mg via ORAL
  Filled 2022-06-05 (×5): qty 1

## 2022-06-05 MED ORDER — ACETAMINOPHEN 325 MG PO TABS: 650.0000 mg | ORAL_TABLET | Freq: Four times a day (QID) | ORAL | Status: DC | PRN

## 2022-06-05 MED ORDER — METOCLOPRAMIDE HCL 5 MG/ML IJ SOLN: 5.0000 mg | Freq: Three times a day (TID) | INTRAMUSCULAR | Status: DC | PRN

## 2022-06-05 MED ORDER — ASPIRIN 325 MG PO TBEC
325.0000 mg | DELAYED_RELEASE_TABLET | Freq: Every day | ORAL | Status: DC
  Administered 2022-06-06 – 2022-06-09 (×4): 325 mg via ORAL
  Filled 2022-06-05 (×5): qty 1

## 2022-06-05 MED ORDER — BUPIVACAINE IN DEXTROSE 0.75-8.25 % IT SOLN
INTRATHECAL | Status: DC | PRN
  Administered 2022-06-05: 1.6 mL via INTRATHECAL

## 2022-06-05 MED ORDER — HYDROCODONE-ACETAMINOPHEN 5-325 MG PO TABS
1.0000 | ORAL_TABLET | ORAL | Status: DC | PRN
  Administered 2022-06-05 – 2022-06-07 (×7): 2 via ORAL
  Administered 2022-06-08: 1 via ORAL
  Administered 2022-06-08: 2 via ORAL
  Administered 2022-06-09 (×2): 1 via ORAL
  Filled 2022-06-05 (×2): qty 2
  Filled 2022-06-05 (×2): qty 1
  Filled 2022-06-05 (×5): qty 2
  Filled 2022-06-05: qty 1
  Filled 2022-06-05 (×2): qty 2

## 2022-06-05 MED ORDER — CHLORHEXIDINE GLUCONATE 4 % EX LIQD: 60.0000 mL | Freq: Once | CUTANEOUS | Status: DC

## 2022-06-05 MED ORDER — LACTATED RINGERS IV SOLN: INTRAVENOUS | Status: DC

## 2022-06-05 MED ORDER — LOSARTAN POTASSIUM 50 MG PO TABS
50.0000 mg | ORAL_TABLET | Freq: Every day | ORAL | Status: DC
  Administered 2022-06-06: 50 mg via ORAL
  Filled 2022-06-05: qty 1

## 2022-06-05 MED ORDER — WATER FOR IRRIGATION, STERILE IR SOLN
Status: DC | PRN
  Administered 2022-06-05: 2000 mL

## 2022-06-05 MED ORDER — ONDANSETRON HCL 4 MG/2ML IJ SOLN: 4.0000 mg | Freq: Once | INTRAMUSCULAR | Status: DC | PRN

## 2022-06-05 MED ORDER — HYDROCODONE-ACETAMINOPHEN 7.5-325 MG PO TABS: 1.0000 | ORAL_TABLET | ORAL | Status: DC | PRN

## 2022-06-05 MED ORDER — FENTANYL CITRATE PF 50 MCG/ML IJ SOSY
PREFILLED_SYRINGE | INTRAMUSCULAR | Status: AC
  Administered 2022-06-05: 25 ug via INTRAVENOUS
  Filled 2022-06-05: qty 1

## 2022-06-05 MED ORDER — FENTANYL CITRATE (PF) 100 MCG/2ML IJ SOLN
INTRAMUSCULAR | Status: DC | PRN
  Administered 2022-06-05: 50 ug via INTRAVENOUS

## 2022-06-05 MED ORDER — PHENOL 1.4 % MT LIQD: 1.0000 | OROMUCOSAL | Status: DC | PRN

## 2022-06-05 MED ORDER — ACETAMINOPHEN 500 MG PO TABS
1000.0000 mg | ORAL_TABLET | Freq: Once | ORAL | Status: AC
  Administered 2022-06-05: 1000 mg via ORAL
  Filled 2022-06-05: qty 2

## 2022-06-05 MED ORDER — ONDANSETRON HCL 4 MG PO TABS: 4.0000 mg | ORAL_TABLET | Freq: Four times a day (QID) | ORAL | Status: DC | PRN

## 2022-06-05 MED ORDER — FENTANYL CITRATE PF 50 MCG/ML IJ SOSY
PREFILLED_SYRINGE | INTRAMUSCULAR | Status: AC
  Administered 2022-06-05: 25 ug via INTRAVENOUS
  Filled 2022-06-05: qty 2

## 2022-06-05 MED ORDER — FENTANYL CITRATE PF 50 MCG/ML IJ SOSY
25.0000 ug | PREFILLED_SYRINGE | Freq: Once | INTRAMUSCULAR | Status: AC
  Administered 2022-06-05: 50 ug via INTRAVENOUS

## 2022-06-05 MED ORDER — METHOCARBAMOL 500 MG IVPB - SIMPLE MED: 500.0000 mg | Freq: Four times a day (QID) | INTRAVENOUS | Status: DC | PRN

## 2022-06-05 MED ORDER — BUPIVACAINE-EPINEPHRINE 0.25% -1:200000 IJ SOLN
INTRAMUSCULAR | Status: DC | PRN
  Administered 2022-06-05: 30 mL

## 2022-06-05 MED ORDER — OXYCODONE HCL 5 MG PO TABS: 5.0000 mg | ORAL_TABLET | Freq: Once | ORAL | Status: DC | PRN

## 2022-06-05 MED ORDER — FENTANYL CITRATE PF 50 MCG/ML IJ SOSY
25.0000 ug | PREFILLED_SYRINGE | INTRAMUSCULAR | Status: DC | PRN
  Administered 2022-06-05 (×2): 25 ug via INTRAVENOUS

## 2022-06-05 MED ORDER — SENNOSIDES-DOCUSATE SODIUM 8.6-50 MG PO TABS: 2.0000 | ORAL_TABLET | Freq: Every day | ORAL | Status: DC

## 2022-06-05 MED ORDER — LIDOCAINE HCL (CARDIAC) PF 100 MG/5ML IV SOSY
PREFILLED_SYRINGE | INTRAVENOUS | Status: DC | PRN
  Administered 2022-06-05: 60 mg via INTRAVENOUS

## 2022-06-05 MED ORDER — POLYETHYLENE GLYCOL 3350 17 G PO PACK
17.0000 g | PACK | Freq: Every day | ORAL | Status: DC
  Administered 2022-06-07 – 2022-06-09 (×3): 17 g via ORAL
  Filled 2022-06-05 (×4): qty 1

## 2022-06-05 MED ORDER — SENNA 8.6 MG PO TABS
1.0000 | ORAL_TABLET | Freq: Two times a day (BID) | ORAL | Status: DC
  Administered 2022-06-06 – 2022-06-09 (×8): 8.6 mg via ORAL
  Filled 2022-06-05 (×7): qty 1

## 2022-06-05 SURGICAL SUPPLY — 52 items
BAG COUNTER SPONGE SURGICOUNT (BAG) IMPLANT
BAG DECANTER FOR FLEXI CONT (MISCELLANEOUS) IMPLANT
BAG ZIPLOCK 12X15 (MISCELLANEOUS) IMPLANT
CHLORAPREP W/TINT 26 (MISCELLANEOUS) ×1 IMPLANT
COVER PERINEAL POST (MISCELLANEOUS) ×1 IMPLANT
COVER SURGICAL LIGHT HANDLE (MISCELLANEOUS) ×1 IMPLANT
DERMABOND ADVANCED .7 DNX12 (GAUZE/BANDAGES/DRESSINGS) ×2 IMPLANT
DRAPE IMP U-DRAPE 54X76 (DRAPES) ×1 IMPLANT
DRAPE SHEET LG 3/4 BI-LAMINATE (DRAPES) ×3 IMPLANT
DRAPE STERI IOBAN 125X83 (DRAPES) ×1 IMPLANT
DRAPE U-SHAPE 47X51 STRL (DRAPES) ×2 IMPLANT
DRSG AQUACEL AG ADV 3.5X10 (GAUZE/BANDAGES/DRESSINGS) ×1 IMPLANT
ELECT REM PT RETURN 15FT ADLT (MISCELLANEOUS) ×1 IMPLANT
GAUZE SPONGE 4X4 12PLY STRL (GAUZE/BANDAGES/DRESSINGS) ×1 IMPLANT
GLOVE BIO SURGEON STRL SZ8.5 (GLOVE) ×2 IMPLANT
GLOVE BIOGEL M 7.0 STRL (GLOVE) ×1 IMPLANT
GLOVE BIOGEL PI IND STRL 7.5 (GLOVE) ×1 IMPLANT
GLOVE BIOGEL PI IND STRL 8.5 (GLOVE) ×1 IMPLANT
GOWN SPEC L3 XXLG W/TWL (GOWN DISPOSABLE) ×1 IMPLANT
GOWN STRL REUS W/ TWL XL LVL3 (GOWN DISPOSABLE) ×1 IMPLANT
GOWN STRL REUS W/TWL XL LVL3 (GOWN DISPOSABLE) ×1
HANDPIECE INTERPULSE COAX TIP (DISPOSABLE) ×1
HEAD MOD COCR 28MM HD -3MM NK (Orthopedic Implant) IMPLANT
HOLDER FOLEY CATH W/STRAP (MISCELLANEOUS) ×1 IMPLANT
HOOD PEEL AWAY FLYTE STAYCOOL (MISCELLANEOUS) ×3 IMPLANT
KIT TURNOVER KIT A (KITS) IMPLANT
MANIFOLD NEPTUNE II (INSTRUMENTS) ×1 IMPLANT
MARKER SKIN DUAL TIP RULER LAB (MISCELLANEOUS) ×1 IMPLANT
NDL SAFETY ECLIP 18X1.5 (MISCELLANEOUS) ×1 IMPLANT
NDL SPNL 18GX3.5 QUINCKE PK (NEEDLE) ×1 IMPLANT
NEEDLE SPNL 18GX3.5 QUINCKE PK (NEEDLE) ×1 IMPLANT
PACK ANTERIOR HIP CUSTOM (KITS) ×1 IMPLANT
PENCIL SMOKE EVACUATOR (MISCELLANEOUS) IMPLANT
RINGBLOC BI POLAR 28X45MM (Orthopedic Implant) ×1 IMPLANT
SAW OSC TIP CART 19.5X105X1.3 (SAW) ×1 IMPLANT
SEALER BIPOLAR AQUA 6.0 (INSTRUMENTS) ×1 IMPLANT
SET HNDPC FAN SPRY TIP SCT (DISPOSABLE) ×1 IMPLANT
SHELL RINGBLOC BI POLR 28X45MM (Orthopedic Implant) IMPLANT
SOLUTION PRONTOSAN WOUND 350ML (IRRIGATION / IRRIGATOR) ×1 IMPLANT
SPIKE FLUID TRANSFER (MISCELLANEOUS) ×1 IMPLANT
STEM FEM CMTLS HIP 14X148 123D (Stem) IMPLANT
SUT MNCRL AB 3-0 PS2 18 (SUTURE) ×1 IMPLANT
SUT MON AB 2-0 CT1 36 (SUTURE) ×1 IMPLANT
SUT STRATAFIX PDO 1 14 VIOLET (SUTURE) ×1
SUT STRATFX PDO 1 14 VIOLET (SUTURE) ×1
SUT VIC AB 2-0 CT1 27 (SUTURE)
SUT VIC AB 2-0 CT1 TAPERPNT 27 (SUTURE) IMPLANT
SUTURE STRATFX PDO 1 14 VIOLET (SUTURE) ×1 IMPLANT
SYR 3ML LL SCALE MARK (SYRINGE) ×1 IMPLANT
TRAY FOLEY MTR SLVR 16FR STAT (SET/KITS/TRAYS/PACK) IMPLANT
TUBE SUCTION HIGH CAP CLEAR NV (SUCTIONS) ×1 IMPLANT
WATER STERILE IRR 1000ML POUR (IV SOLUTION) ×1 IMPLANT

## 2022-06-05 NOTE — Anesthesia Postprocedure Evaluation (Signed)
Anesthesia Post Note  Patient: Dana Blake  Procedure(s) Performed: hip hemiarthroplasty anterior approach (Left: Hip)     Patient location during evaluation: PACU Anesthesia Type: Spinal Level of consciousness: oriented and awake and alert Pain management: pain level controlled Vital Signs Assessment: post-procedure vital signs reviewed and stable Respiratory status: spontaneous breathing, respiratory function stable and nonlabored ventilation Cardiovascular status: blood pressure returned to baseline and stable Postop Assessment: no headache, no backache, no apparent nausea or vomiting and spinal receding Anesthetic complications: no   No notable events documented.  Last Vitals:  Vitals:   06/05/22 1800 06/05/22 1815  BP: 114/83 118/60  Pulse: 81 80  Resp: 17 13  Temp:    SpO2: 96% 95%    Last Pain:  Vitals:   06/05/22 1800  TempSrc:   PainSc: 4                  Gracelee Stemmler A.

## 2022-06-05 NOTE — ED Notes (Signed)
Repositioned for comfort

## 2022-06-05 NOTE — Discharge Instructions (Signed)
 Dr. Brian Swinteck Joint Replacement Specialist [facility] [address] [place], [place] [address] [phone]   HIP REPLACEMENT POSTOPERATIVE DIRECTIONS    Hip Rehabilitation, Guidelines Following Surgery   WEIGHT BEARING Weight bearing as tolerated with assist device (walker, cane, etc) as directed, use it as long as suggested by your surgeon or therapist, typically at least 4-6 weeks.  The results of a hip operation are greatly improved after range of motion and muscle strengthening exercises. Follow all safety measures which are given to protect your hip. If any of these exercises cause increased pain or swelling in your joint, decrease the amount until you are comfortable again. Then slowly increase the exercises. Call your caregiver if you have problems or questions.   HOME CARE INSTRUCTIONS  Most of the following instructions are designed to prevent the dislocation of your new hip.  Remove items at home which could result in a fall. This includes throw rugs or furniture in walking pathways.  Continue medications as instructed at time of discharge. You may have some home medications which will be placed on hold until you complete the course of blood thinner medication. You may start showering once you are discharged home. Do not remove your dressing. Do not put on socks or shoes without following the instructions of your caregivers.   Sit on chairs with arms. Use the chair arms to help push yourself up when arising.  Arrange for the use of a toilet seat elevator so you are not sitting low.  Walk with walker as instructed.  You may resume a sexual relationship in one month or when given the OK by your caregiver.  Use walker as long as suggested by your caregivers.  You may put full weight on your legs and walk as much as is comfortable. Avoid periods of inactivity such as sitting longer than an hour when not asleep. This helps prevent blood clots.   You may return to work once you are cleared by your surgeon.  Do not drive a car for 6 weeks or until released by your surgeon.  Do not drive while taking narcotics.  Wear elastic stockings for two weeks following surgery during the day but you may remove then at night.  Make sure you keep all of your appointments after your operation with all of your doctors and caregivers. You should call the office at the above phone number and make an appointment for approximately two weeks after the date of your surgery. Please pick up a stool softener and laxative for home use as long as you are requiring pain medications. ICE to the affected hip every three hours for 30 minutes at a time and then as needed for pain and swelling. Continue to use ice on the hip for pain and swelling from surgery. You may notice swelling that will progress down to the foot and ankle.  This is normal after surgery.  Elevate the leg when you are not up walking on it.   It is important for you to complete the blood thinner medication as prescribed by your doctor. Continue to use the breathing machine which will help keep your temperature down.  It is common for your temperature to cycle up and down following surgery, especially at night when you are not up moving around and exerting yourself.  The breathing machine keeps your lungs expanded and your temperature down.  RANGE OF MOTION AND STRENGTHENING EXERCISES  These exercises are designed to help you keep   full movement of your hip joint. Follow your caregiver's or physical therapist's instructions. Perform all exercises about fifteen times, three times per day or as directed. Exercise both hips, even if you have had only one joint replacement. These exercises can be done on a training (exercise) mat, on the floor, on a table or on a bed. Use whatever works the best and is most comfortable for you. Use music or television while you are exercising so that the exercises are a pleasant  break in your day. This will make your life better with the exercises acting as a break in routine you can look forward to.  Lying on your back, slowly slide your foot toward your buttocks, raising your knee up off the floor. Then slowly slide your foot back down until your leg is straight again.  Lying on your back spread your legs as far apart as you can without causing discomfort.  Lying on your side, raise your upper leg and foot straight up from the floor as far as is comfortable. Slowly lower the leg and repeat.  Lying on your back, tighten up the muscle in the front of your thigh (quadriceps muscles). You can do this by keeping your leg straight and trying to raise your heel off the floor. This helps strengthen the largest muscle supporting your knee.  Lying on your back, tighten up the muscles of your buttocks both with the legs straight and with the knee bent at a comfortable angle while keeping your heel on the floor.   SKILLED REHAB INSTRUCTIONS: If the patient is transferred to a skilled rehab facility following release from the hospital, a list of the current medications will be sent to the facility for the patient to continue.  When discharged from the skilled rehab facility, please have the facility set up the patient's Home Health Physical Therapy prior to being released. Also, the skilled facility will be responsible for providing the patient with their medications at time of release from the facility to include their pain medication and their blood thinner medication. If the patient is still at the rehab facility at time of the two week follow up appointment, the skilled rehab facility will also need to assist the patient in arranging follow up appointment in our office and any transportation needs.  POST-OPERATIVE OPIOID TAPER INSTRUCTIONS: It is important to wean off of your opioid medication as soon as possible. If you do not need pain medication after your surgery it is ok to stop  day one. Opioids include: Codeine, Hydrocodone(Norco, Vicodin), Oxycodone(Percocet, oxycontin) and hydromorphone amongst others.  Long term and even short term use of opiods can cause: Increased pain response Dependence Constipation Depression Respiratory depression And more.  Withdrawal symptoms can include Flu like symptoms Nausea, vomiting And more Techniques to manage these symptoms Hydrate well Eat regular healthy meals Stay active Use relaxation techniques(deep breathing, meditating, yoga) Do Not substitute Alcohol to help with tapering If you have been on opioids for less than two weeks and do not have pain than it is ok to stop all together.  Plan to wean off of opioids This plan should start within one week post op of your joint replacement. Maintain the same interval or time between taking each dose and first decrease the dose.  Cut the total daily intake of opioids by one tablet each day Next start to increase the time between doses. The last dose that should be eliminated is the evening dose.    MAKE SURE   YOU:  Understand these instructions.  Will watch your condition.  Will get help right away if you are not doing well or get worse.  Pick up stool softner and laxative for home use following surgery while on pain medications. Do not remove your dressing. The dressing is waterproof--it is OK to take showers. Continue to use ice for pain and swelling after surgery. Do not use any lotions or creams on the incision until instructed by your surgeon. Total Hip Protocol.  

## 2022-06-05 NOTE — Progress Notes (Signed)
[facility] PROGRESS NOTE   Dana Blake GLO:[id number] DOB: Dec 06, 1926 DOA: 06/04/2022  PCP: [facility] [place]  Brief History/Interval Summary: 88 y.o. female with medical history significant of hypertension anemia, depression who presented after a mechanical fall.  She lives in an independent living facility, [facility] screen.  She was found to have left hip fracture.  She was hospitalized for further management.  Consultants: Orthopedics  Procedures: None yet    Subjective/Interval History: Patient complains of severe pain in the left hip area.  Denies any chest pain shortness of breath.  Denies any history of heart disease.  No nausea vomiting.    Assessment/Plan:  Left hip fracture Management per orthopedics.  Plan is for surgical intervention.  See H&P for risk stratification.  EKG is poor quality.  Computer has read as atrial fibrillation although I do see QRS complexes immediately after P waves.  Likely has PACs.  We will have them repeat another EKG.  Does not change management going forward.  She can proceed to surgery without any additional testing at this time. Pain control.  Bowel regimen.  Abnormal EKG Likely poor quality EKG.  Likely has PACs.  Will have nursing staff repeat an EKG.  Monitor on telemetry for now.  If irregular rhythm/atrial fibrillation is confirmed then we will do additional evaluation.  TSH is noted to be normal.  Essential hypertension Losartan being continued.  GERD Continue with PPI.  Also noted to be on Pepcid.  History of glaucoma Noted to be on Xalatan eyedrops which have been continued.  DVT Prophylaxis: Definitive prophylaxis after surgery Code Status: Full code Family Communication: Discussed with her son Disposition Plan: To be determined  Status is: Inpatient Remains inpatient appropriate because: Left hip fracture requiring surgical intervention      Medications: Scheduled:  famotidine   20 mg Oral QHS   latanoprost  1 drop Both Eyes QHS   losartan  50 mg Oral Daily   pantoprazole  40 mg Oral Daily   Continuous:  methocarbamol (ROBAXIN) IV     JOA:[id number], HYDROmorphone (DILAUDID) injection, methocarbamol **OR** methocarbamol (ROBAXIN) IV  Antibiotics: Anti-infectives (From admission, onward)    None       Objective:  Vital Signs  Vitals:   06/05/22 0500 06/05/22 0524 06/05/22 0700 06/05/22 0800  BP: (!) 143/66  (!) 137/51 102/76  Pulse: 75  84 84  Resp: (!) 21  20 16   Temp:  98 F (36.7 C)    TempSrc:  Oral    SpO2: 98%  100% 95%   No intake or output data in the 24 hours ending 06/05/22 0937 There were no vitals filed for this visit.  General appearance: Awake alert.  In no distress.  In significant discomfort Resp: Clear to auscultation bilaterally.  Normal effort Cardio: S1-S2 is normal regular.  No S3-S4.  No rubs murmurs or bruit GI: Abdomen is soft.  Nontender nondistended.  Bowel sounds are present normal.  No masses organomegaly Extremities: Left lower extremity is externally rotated Neurologic:   No focal neurological deficits.    Lab Results:  Data Reviewed: I have personally reviewed following labs and reports of the imaging studies  CBC: Recent Labs  Lab 06/04/22 1530  WBC 9.0  NEUTROABS 6.7  HGB 12.8  HCT 39.6  MCV 88.6  PLT 313    Basic Metabolic Panel: Recent Labs  Lab 06/04/22 1530  NA 139  K 4.4  CL 106  CO2 26  GLUCOSE  102*  BUN 16  CREATININE 0.57  CALCIUM 8.8*    GFR: CrCl cannot be calculated (Unknown ideal weight.).   Cardiac Enzymes: Recent Labs  Lab 06/04/22 1530  CKTOTAL 74     Thyroid Function Tests: Recent Labs    06/05/22 0449  TSH 1.178    Radiology Studies: DG Knee Left Port  Result Date: 06/05/2022 CLINICAL DATA:  88 year old female with history of closed displaced fracture of the left femoral neck. EXAM: PORTABLE LEFT KNEE - 1-2 VIEW COMPARISON:  No  priors. FINDINGS: Two views of the left knee demonstrate no acute displaced fracture, subluxation or dislocation. Mild joint space narrowing, subchondral sclerosis and osteophyte formation is noted, indicative of osteoarthritis. Chondrocalcinosis is also noted. IMPRESSION: 1. No acute radiographic abnormality of the left knee. 2. Chondrocalcinosis. Electronically Signed   By: Vinnie Langton M.D.   On: 06/05/2022 07:03   DG CHEST PORT 1 VIEW  Result Date: 06/04/2022 CLINICAL DATA:  Preop exam EXAM: PORTABLE CHEST 1 VIEW COMPARISON:  Chest 03/16/2022 FINDINGS: The heart size and mediastinal contours are within normal limits. Both lungs are clear. The visualized skeletal structures are unremarkable. IMPRESSION: No active disease. Electronically Signed   By: Franchot Gallo M.D.   On: 06/04/2022 20:00   CT Hip Left Wo Contrast  Result Date: 06/04/2022 CLINICAL DATA:  Left hip pain after fall. EXAM: CT OF THE LEFT HIP WITHOUT CONTRAST TECHNIQUE: Multidetector CT imaging of the left hip was performed according to the standard protocol. Multiplanar CT image reconstructions were also generated. RADIATION DOSE REDUCTION: This exam was performed according to the departmental dose-optimization program which includes automated exposure control, adjustment of the mA and/or kV according to patient size and/or use of iterative reconstruction technique. COMPARISON:  Left hip x-rays from same day. FINDINGS: Bones/Joint/Cartilage Acute nondisplaced subcapital left femoral neck fracture. No additional fracture. No dislocation. Bilateral hip degenerative changes with chondrocalcinosis. Osteopenia. No joint effusion. Ligaments Ligaments are suboptimally evaluated by CT. Muscles and Tendons Grossly intact. Soft tissue No fluid collection or hematoma.  No soft tissue mass. IMPRESSION: 1. Acute nondisplaced subcapital left femoral neck fracture. Electronically Signed   By: Titus Dubin M.D.   On: 06/04/2022 17:30   DG Hip  Unilat W or Wo Pelvis 2-3 Views Left  Result Date: 06/04/2022 CLINICAL DATA:  08/02/2010 EXAM: DG HIP (WITH OR WITHOUT PELVIS) 2-3V LEFT COMPARISON:  08/02/2010 FINDINGS: There is no evidence of hip fracture or dislocation. There is no evidence of arthropathy or other focal bone abnormality. IMPRESSION: Negative. Electronically Signed   By: Kerby Moors M.D.   On: 06/04/2022 16:48       LOS: 1 day   [facility] Pager on [email or web address]  06/05/2022, 9:37 AM

## 2022-06-05 NOTE — Anesthesia Procedure Notes (Signed)
Spinal  Patient location during procedure: OR Start time: 06/05/2022 3:43 PM End time: 06/05/2022 3:48 PM Reason for block: surgical anesthesia Staffing Performed: anesthesiologist  Anesthesiologist: Josephine Igo, MD Performed by: Josephine Igo, MD Authorized by: Josephine Igo, MD   Preanesthetic Checklist Completed: patient identified, IV checked, site marked, risks and benefits discussed, surgical consent, monitors and equipment checked, pre-op evaluation and timeout performed Spinal Block Patient position: left lateral decubitus Prep: DuraPrep and site prepped and draped Patient monitoring: heart rate, cardiac monitor, continuous pulse ox and blood pressure Approach: midline Location: L3-4 Injection technique: single-shot Needle Needle type: Pencan  Needle gauge: 24 G Needle length: 9 cm Needle insertion depth: 7 cm Assessment Sensory level: T6 Events: CSF return Additional Notes Patient tolerated procedure well. Adequate sensory level.

## 2022-06-05 NOTE — Anesthesia Preprocedure Evaluation (Signed)
Anesthesia Evaluation  Patient identified by MRN, date of birth, ID band Patient awake    Reviewed: Allergy & Precautions, NPO status , Patient's Chart, lab work & pertinent test results  Airway Mallampati: II  TM Distance: >3 FB Neck ROM: Full    Dental  (+) Lower Dentures, Upper Dentures   Pulmonary COPD,  COPD inhaler, former smoker,    Pulmonary exam normal breath sounds clear to auscultation       Cardiovascular hypertension, Pt. on medications Normal cardiovascular exam Rhythm:Regular Rate:Normal     Neuro/Psych PSYCHIATRIC DISORDERS Anxiety Depression Macular degeneration- legally blind    GI/Hepatic PUD, GERD  Medicated,  Endo/Other  Hyperlipidemia  Renal/GU negative Renal ROS  negative genitourinary   Musculoskeletal  (+) Arthritis , Osteoarthritis,  Left femoral neck Fx   Abdominal   Peds  Hematology  (+) Blood dyscrasia, anemia ,   Anesthesia Other Findings   Reproductive/Obstetrics                             Anesthesia Physical Anesthesia Plan  ASA: 3  Anesthesia Plan: Spinal   Post-op Pain Management: Minimal or no pain anticipated and Regional block*   Induction: Inhalational  PONV Risk Score and Plan: 3 and Treatment may vary due to age or medical condition, Ondansetron and Propofol infusion  Airway Management Planned: Natural Airway, Nasal Cannula and Simple Face Mask  Additional Equipment: None  Intra-op Plan:   Post-operative Plan:   Informed Consent: I have reviewed the patients History and Physical, chart, labs and discussed the procedure including the risks, benefits and alternatives for the proposed anesthesia with the patient or authorized representative who has indicated his/her understanding and acceptance.     Dental advisory given  Plan Discussed with: CRNA and Anesthesiologist  Anesthesia Plan Comments:         Anesthesia Quick  Evaluation

## 2022-06-05 NOTE — Op Note (Signed)
OPERATIVE REPORT  SURGEON: Rod Can, MD   ASSISTANT: Larene Pickett, PA-C  PREOPERATIVE DIAGNOSIS: Displaced Left femoral neck fracture.   POSTOPERATIVE DIAGNOSIS: Displaced Left femoral neck fracture.   PROCEDURE: Left hip hemiarthroplasty, anterior approach.   IMPLANTS: Biomet Taperloc Complete Reduced Distal stem, size 14 x 159m, high offset, with a 28-3 mm metal head ball and a 45 mm bipolar articulation.  ANESTHESIA:  MAC and Spinal  ANTIBIOTICS: 2g ancef.  ESTIMATED BLOOD LOSS: 150 mL    DRAINS: None.  COMPLICATIONS: None   CONDITION: PACU - hemodynamically stable.   BRIEF CLINICAL NOTE: Dana COILis a 88y.o. female with a displaced Left femoral neck fracture. The patient was admitted to the hospitalist service and underwent perioperative risk stratification and medical optimization. The risks, benefits, and alternatives to hemiarthroplasty were explained, and the patient elected to proceed.  PROCEDURE IN DETAIL: The patient was taken to the operating room and general anesthesia was induced on the hospital bed.  The patient was then positioned on the Hana table.  All bony prominences were well padded.  The hip was prepped and draped in the normal sterile surgical fashion.  A time-out was called verifying side and site of surgery. Antibiotics were given within 60 minutes of beginning the procedure.   Bikini incision was made, and the direct anterior approach to the hip was performed through the Hueter interval.  Superficial dissection was carried out lateral to the ASIS. Lateral femoral circumflex vessels were treated with the Auqumantys. The anterior capsule was exposed and an inverted T capsulotomy was made. Fracture hematoma was encountered and evacuated. The patient was found to have a comminuted Left subcapital femoral neck fracture.  Inferior pubofemoral ligament was released subperiosteally to the lesser trochanter. I freshened the femoral neck cut with a saw.  I  removed the femoral neck fragment.  A corkscrew was placed into the head and the head was removed.  This was passed to the back table and was measured.   Acetabular exposure was achieved.  I examined the articular cartilage which was intact.  The labrum was intact. A 45 mm trial head was placed and found to have excellent fit.   I then gained femoral exposure taking care to protect the abductors and greater trochanter.  The superior capsule was incised longitudinally, staying lateral to the posterior border of the femoral neck. External rotation, extension, and adduction were applied.  A cookie cutter was used to enter the femoral canal, and then the femoral canal finder was used to confirm location.  I then sequentially broached up to a size 14.  Calcar planer was used on the femoral neck remnant.  I placed a high offset neck and a 45 mm bipolar trial construct. The hip was reduced.  Leg lengths were checked fluoroscopically.  The hip was dislocated and trial components were removed.  I placed the real stem followed by the real bipolar articulation.  A single reduction maneuver was performed and the hip was reduced.  Fluoroscopy was used to confirm component position and leg lengths.  At 90 degrees of external rotation and extension, the hip was stable to an anterior directed force.   The wound was copiously irrigated with Prontosan solution and normal saline using pulse lavage.  Marcaine solution was injected into the periarticular soft tissue.  The wound was closed in layers using #1 Stratafix for the fascia, 2-0 Vicryl for the subcutaneous fat, 2-0 Monocryl for the deep dermal layer, and staples + Dermabond  for the skin.  Once the glue was fully dried, an Aquacell Ag dressing was applied.  The patient was then awakened from anesthesia and transported to the recovery room in stable condition.  Sponge, needle, and instrument counts were correct at the end of the case x2.  The patient tolerated the procedure  well and there were no known complications.  Please note that a surgical assistant was a medical necessity for this procedure to perform it in a safe and expeditious manner. Assistant was necessary to provide appropriate retraction of vital neurovascular structures, to prevent femoral fracture, and to allow for anatomic placement of the prosthesis.

## 2022-06-05 NOTE — Transfer of Care (Signed)
Immediate Anesthesia Transfer of Care Note  Patient: Dana Blake  Procedure(s) Performed: hip hemiarthroplasty anterior approach (Left: Hip)  Patient Location: PACU  Anesthesia Type:Spinal  Level of Consciousness: awake, alert , oriented and patient cooperative  Airway & Oxygen Therapy: Patient Spontanous Breathing and Patient connected to face mask oxygen  Post-op Assessment: Report given to RN and Post -op Vital signs reviewed and stable  Post vital signs: Reviewed and stable  Last Vitals:  Vitals Value Taken Time  BP 108/56 06/05/22 1711  Temp    Pulse 75 06/05/22 1714  Resp 26 06/05/22 1714  SpO2 100% 06/05/22 1716  Vitals shown include unvalidated device data.  Last Pain:  Vitals:   06/05/22 1347  TempSrc:   PainSc: 9       Patients Stated Pain Goal: 3 (69/48/54 6270)  Complications: No notable events documented.

## 2022-06-05 NOTE — Interval H&P Note (Signed)
History and Physical Interval Note:  06/05/2022 3:30 PM  Dana Blake  has presented today for surgery, with the diagnosis of left femoral neck fracture.  The various methods of treatment have been discussed with the patient and family. After consideration of risks, benefits and other options for treatment, the patient has consented to  Procedure(s): hip hemiarthroplasty anterior approach (Left) as a surgical intervention.  The patient's history has been reviewed, patient examined, no change in status, stable for surgery.  I have reviewed the patient's chart and labs.  Questions were answered to the patient's satisfaction.    The risks, benefits, and alternatives were discussed with the patient. There are risks associated with the surgery including, but not limited to, problems with anesthesia (death), infection, instability (giving out of the joint), dislocation, differences in leg length/angulation/rotation, fracture of bones, loosening or failure of implants, hematoma (blood accumulation) which may require surgical drainage, blood clots, pulmonary embolism, nerve injury (foot drop and lateral thigh numbness), and blood vessel injury. The patient understands these risks and elects to proceed.   Hilton Cork Saranne Crislip

## 2022-06-06 ENCOUNTER — Encounter (HOSPITAL_COMMUNITY): Payer: Self-pay | Admitting: Orthopedic Surgery

## 2022-06-06 DIAGNOSIS — S72002A Fracture of unspecified part of neck of left femur, initial encounter for closed fracture: Secondary | ICD-10-CM | POA: Diagnosis not present

## 2022-06-06 DIAGNOSIS — R9431 Abnormal electrocardiogram [ECG] [EKG]: Secondary | ICD-10-CM | POA: Diagnosis not present

## 2022-06-06 DIAGNOSIS — I10 Essential (primary) hypertension: Secondary | ICD-10-CM | POA: Diagnosis not present

## 2022-06-06 DIAGNOSIS — E440 Moderate protein-calorie malnutrition: Secondary | ICD-10-CM | POA: Insufficient documentation

## 2022-06-06 LAB — BASIC METABOLIC PANEL
Anion gap: 5 (ref 5–15)
BUN: 17 mg/dL (ref 8–23)
CO2: 28 mmol/L (ref 22–32)
Calcium: 8.2 mg/dL — ABNORMAL LOW (ref 8.9–10.3)
Chloride: 103 mmol/L (ref 98–111)
Creatinine, Ser: 0.71 mg/dL (ref 0.44–1.00)
GFR, Estimated: >60 mL/min (ref >60)
Glucose, Bld: 105 mg/dL — ABNORMAL HIGH (ref 70–99)
Potassium: 3.6 mmol/L (ref 3.5–5.1)
Sodium: 136 mmol/L (ref 135–145)

## 2022-06-06 LAB — CBC
HCT: 37 % (ref 36.0–46.0)
Hemoglobin: 11.5 g/dL — ABNORMAL LOW (ref 12.0–15.0)
MCH: 28.4 pg (ref 26.0–34.0)
MCHC: 31.1 g/dL (ref 30.0–36.0)
MCV: 91.4 fL (ref 80.0–100.0)
Platelets: 199 10*3/uL (ref 150–400)
RBC: 4.05 MIL/uL (ref 3.87–5.11)
RDW: 14.4 % (ref 11.5–15.5)
WBC: 10 10*3/uL (ref 4.0–10.5)
nRBC: 0 % (ref 0.0–0.2)

## 2022-06-06 MED ORDER — METOPROLOL TARTRATE 25 MG PO TABS
25.0000 mg | ORAL_TABLET | Freq: Two times a day (BID) | ORAL | Status: DC
  Administered 2022-06-06 – 2022-06-08 (×4): 25 mg via ORAL
  Filled 2022-06-06 (×4): qty 1

## 2022-06-06 MED ORDER — HYDROCODONE-ACETAMINOPHEN 10-325 MG PO TABS: 0.5000 | ORAL_TABLET | ORAL | 0 refills | Status: AC | PRN

## 2022-06-06 MED ORDER — POTASSIUM CHLORIDE 20 MEQ PO PACK
40.0000 meq | PACK | Freq: Once | ORAL | Status: AC
  Administered 2022-06-06: 40 meq via ORAL
  Filled 2022-06-06: qty 2

## 2022-06-06 MED ORDER — VITAMIN D (ERGOCALCIFEROL) 1.25 MG (50000 UNIT) PO CAPS
50000.0000 [IU] | ORAL_CAPSULE | ORAL | Status: DC
  Administered 2022-06-06: 50000 [IU] via ORAL
  Filled 2022-06-06: qty 1

## 2022-06-06 MED ORDER — ENSURE ENLIVE PO LIQD
237.0000 mL | Freq: Two times a day (BID) | ORAL | Status: DC
  Administered 2022-06-06 – 2022-06-09 (×7): 237 mL via ORAL

## 2022-06-06 MED ORDER — ASPIRIN 81 MG PO CHEW: 81.0000 mg | CHEWABLE_TABLET | Freq: Two times a day (BID) | ORAL | 0 refills | Status: AC

## 2022-06-06 NOTE — Progress Notes (Signed)
Initial Nutrition Assessment  DOCUMENTATION CODES:   Non-severe (moderate) malnutrition in context of chronic illness  INTERVENTION:   -Ensure Plus High Protein po BID, each supplement provides 350 kcal and 20 grams of protein.   NUTRITION DIAGNOSIS:   Moderate Malnutrition related to chronic illness as evidenced by moderate fat depletion, severe muscle depletion.  GOAL:   Patient will meet greater than or equal to 90% of their needs  MONITOR:   PO intake, Supplement acceptance, Labs, Weight trends, I & O's  REASON FOR ASSESSMENT:   Consult Hip fracture protocol  ASSESSMENT:   88 y.o. female with medical history significant of hypertension anemia, depression who presented after a mechanical fall.  She lives in an independent living facility, [facility] screen.  She was found to have left hip fracture.  9/28: Left hip hemiarthroplasty, anterior approach  Patient in room, family at bedside. Pt is HOH but is in good spirits. States her appetite is good and she is eating well so far following surgery. She is from independent living and eats 2 meals a day and snacks throughout the day on whatever she wants. Is agreeable to Ensure supplements to aid in post-op healing. Denies issues with chewing or swallowing.  Per weight records, pt has lost 5 lbs since April 2023.  Medications: Colace, KLOR-CON, Senokot, Vitamin D, Miralax  Labs reviewed: Low Vitamin D   NUTRITION - FOCUSED PHYSICAL EXAM:  Flowsheet Row Most Recent Value  Orbital Region Moderate depletion  Upper Arm Region Severe depletion  Thoracic and Lumbar Region Unable to assess  Buccal Region Moderate depletion  Temple Region Moderate depletion  Clavicle Bone Region Severe depletion  Clavicle and Acromion Bone Region Severe depletion  Scapular Bone Region Severe depletion  Dorsal Hand Severe depletion  Patellar Region Severe depletion  Anterior Thigh Region Severe depletion  Posterior Calf Region Severe  depletion  Edema (RD Assessment) None  Hair Reviewed  Eyes Reviewed  Mouth Reviewed  Skin Reviewed  Nails Unable to assess  [painted]       Diet Order:   Diet Order             Diet regular Room service appropriate? Yes; Fluid consistency: Thin  Diet effective now                   EDUCATION NEEDS:   Education needs have been addressed  Skin:  Skin Assessment: Skin Integrity Issues: Skin Integrity Issues:: Incisions Incisions: 9/28: lt hip  Last BM:  PTA  Height:   Ht Readings from Last 1 Encounters:  06/05/22 5\' 4"  (1.626 m)    Weight:   Wt Readings from Last 1 Encounters:  06/05/22 52.1 kg    BMI:  Body mass index is 19.72 kg/m.  Estimated Nutritional Needs:   Kcal:  1300-1500  Protein:  65-75g  Fluid:  1.5L/day  Clayton Bibles, MS, RD, LDN Inpatient Clinical Dietitian Contact information available via Amion

## 2022-06-06 NOTE — Evaluation (Signed)
Physical Therapy Evaluation Patient Details Name: Dana Blake MRN: [id number] DOB: 1927/09/05 Today's Date: 06/06/2022  History of Present Illness  88 y.o. female with medical history significant of hypertension anemia, depression who presented after a mechanical fall.  She lives in an independent living facility, [facility] screen.  She was found to have left hip fracture. Pt s/p L hip hemiarthroplasty 06/05/22  Clinical Impression  Pt presents with dependencies in mobility affecting her independence secondary to the above diagnosis. Pt's family is very supportive and is considering taking her back to [facility] greens and providing 24 hr care from the family and HHPT, HHOT services or  SNF level. Pt is currently ambulating over 175' with min guard assist, minimal L LE hip pain and was able to complete LE there ex with min assist. Pt does have some decreased safety with the RW with turns and negotiating obstacles due to her vision. Agree with d/c to SNF if family is unable to arrange 24 hr supervision for 1-2 weeks until she achieves mod independent level. Pt will benefit from continued acute skilled PT to maximize mobility and independence.      Recommendations for follow up therapy are one component of a multi-disciplinary discharge planning process, led by the attending physician.  Recommendations may be updated based on patient status, additional functional criteria and insurance authorization.  Follow Up Recommendations Other (comment) ([facility] supervision with family and HHPT or SNF) Can patient physically be transported by private vehicle: Yes    Assistance Recommended at Discharge Frequent or constant Supervision/Assistance  Patient can return home with the following  A little help with bathing/dressing/bathroom;A little help with walking and/or transfers;Assist for transportation;Help with stairs or ramp for entrance    Equipment  Recommendations Rolling walker (2 wheels)  Recommendations for Other Services       Functional Status Assessment Patient has had a recent decline in their functional status and demonstrates the ability to make significant improvements in function in a reasonable and predictable amount of time.     Precautions / Restrictions Precautions Precautions: Fall;Anterior Hip Restrictions Weight Bearing Restrictions: No      Mobility  Bed Mobility Overal bed mobility: Needs Assistance Bed Mobility: Supine to Sit     Supine to sit: Min assist, HOB elevated     General bed mobility comments: cues for technique    Transfers Overall transfer level: Needs assistance Equipment used: Rolling walker (2 wheels) Transfers: Sit to/from Stand Sit to Stand: Min assist           General transfer comment: cues for hand placement    Ambulation/Gait Ambulation/Gait assistance: Min assist Gait Distance (Feet): 175 Feet Assistive device: Rolling walker (2 wheels) Gait Pattern/deviations: Step-through pattern, Decreased stride length, Trunk flexed Gait velocity: decr     General Gait Details: increased cues needed to stay inside the RW with turns and negotiating obsticles. Pt is legally blind but is able to see forms and some colors  Stairs            Wheelchair Mobility    Modified Rankin (Stroke Patients Only)       Balance Overall balance assessment: Mild deficits observed, not formally tested                                           Pertinent Vitals/Pain Pain Assessment  Pain Assessment: 0-10 Pain Score: 5  Pain Location: L hip Pain Descriptors / Indicators: Aching, Discomfort Pain Intervention(s): Limited activity within patient's tolerance, Repositioned, Monitored during session    Home Living Family/patient expects to be discharged to:: Assisted living Living Arrangements: Alone               Home Equipment: Conservation officer, nature (2 wheels)       Prior Function Prior Level of Function : Independent/Modified Independent                     Hand Dominance        Extremity/Trunk Assessment        Lower Extremity Assessment Lower Extremity Assessment: LLE deficits/detail LLE Deficits / Details: L LE 3/5    Cervical / Trunk Assessment Cervical / Trunk Assessment: Kyphotic  Communication   Communication: No difficulties;HOH  Cognition Arousal/Alertness: Awake/alert Behavior During Therapy: WFL for tasks assessed/performed Overall Cognitive Status: Within Functional Limits for tasks assessed                                 General Comments: Family present for part of the therapy session.        General Comments      Exercises Total Joint Exercises Ankle Circles/Pumps: AROM, Both, 10 reps, Seated Quad Sets: AROM, Strengthening, Left, 10 reps, Seated Heel Slides: AAROM, Strengthening, Left, 10 reps, Supine Hip ABduction/ADduction: AAROM, Strengthening, Left, 10 reps, Supine Long Arc Quad: AROM, Strengthening, Left, 10 reps, Seated   Assessment/Plan    PT Assessment Patient needs continued PT services  PT Problem List Decreased strength;Decreased balance;Pain;Decreased knowledge of precautions;Decreased range of motion;Decreased mobility;Decreased activity tolerance;Decreased safety awareness       PT Treatment Interventions DME instruction;Functional mobility training;Balance training;Patient/family education;Gait training;Therapeutic activities;Therapeutic exercise    PT Goals (Current goals can be found in the Care Plan section)  Acute Rehab PT Goals Patient Stated Goal: The dance again PT Goal Formulation: With patient Time For Goal Achievement: 06/12/22 Potential to Achieve Goals: Good    Frequency 7X/week     Co-evaluation               AM-PAC PT "6 Clicks" Mobility  Outcome Measure Help needed turning from your back to your side while in a flat bed without using  bedrails?: A Little Help needed moving from lying on your back to sitting on the side of a flat bed without using bedrails?: A Little Help needed moving to and from a bed to a chair (including a wheelchair)?: A Little Help needed standing up from a chair using your arms (e.g., wheelchair or bedside chair)?: A Little Help needed to walk in hospital room?: A Little Help needed climbing 3-5 steps with a railing? : A Little 6 Click Score: 18    End of Session Equipment Utilized During Treatment: Gait belt Activity Tolerance: Patient tolerated treatment well Patient left: in chair;with chair alarm set;with family/visitor present;with call bell/phone within reach   PT Visit Diagnosis: Other abnormalities of gait and mobility (R26.89);Muscle weakness (generalized) (M62.81)    Time: 9622-2979 PT Time Calculation (min) (ACUTE ONLY): 50 min   Charges:   PT Evaluation $PT Eval Moderate Complexity: 1 Mod PT Treatments $Gait Training: 8-22 mins $Therapeutic Exercise: 8-22 mins        Lelon Mast 06/06/2022, 12:11 PM

## 2022-06-06 NOTE — Progress Notes (Signed)
    Subjective:  Patient reports pain as mild to moderate.  Denies N/V/CP/SOB/Abd pain. She is doing well this morning. She reports only mild soreness in her leg. She denies tingling and numbness in LE bilaterally.   All questions solicited and answered.   Objective:   VITALS:   Vitals:   06/05/22 1852 06/05/22 2135 06/06/22 0151 06/06/22 0623  BP: (!) 125/58 (!) 96/51 (!) 102/57 (!) 100/51  Pulse: 86 64 73 73  Resp:  17 17 17   Temp: 97.9 F (36.6 C) 98.2 F (36.8 C) 97.7 F (36.5 C) (!) 97.4 F (36.3 C)  TempSrc: Oral Oral  Oral  SpO2: 100% 95% 94% 95%  Weight:      Height:        Patient sleeping this morning, woken up for exam. Her daughter is at bedside. NAD.  Neurologically intact ABD soft Neurovascular intact Sensation intact distally Intact pulses distally Dorsiflexion/Plantar flexion intact Incision: dressing C/D/I No cellulitis present Compartment soft Currently has ice on hip this morning.   Lab Results  Component Value Date   WBC 10.0 06/06/2022   HGB 11.5 (L) 06/06/2022   HCT 37.0 06/06/2022   MCV 91.4 06/06/2022   PLT 199 06/06/2022   BMET    Component Value Date/Time   NA 136 06/06/2022 0336   K 3.6 06/06/2022 0336   CL 103 06/06/2022 0336   CO2 28 06/06/2022 0336   GLUCOSE 105 (H) 06/06/2022 0336   BUN 17 06/06/2022 0336   CREATININE 0.71 06/06/2022 0336   CALCIUM 8.2 (L) 06/06/2022 0336   GFRNONAA >60 06/06/2022 0336     Assessment/Plan: 1 Day Post-Op   Principal Problem:   Closed left hip fracture, initial encounter (HCC) Active Problems:   Essential hypertension   GERD (gastroesophageal reflux disease)   Closed left hip fracture (HCC)   WBAT with walker DVT ppx: Aspirin, SCDs, TEDS PO pain control PT/OT: PT has not seen yet. PT to come by today.  Dispo: Patient under care of the medical team, disposition per their recommendation. Patient lives at an independent living facility, but her daughter states someone might be able  to stay with her vs SNF. Pain medication and DVT ppx printed in chart.     Charlott Rakes, PA-C 06/06/2022, 7:09 AM   [facility]  [place] [address], [address], [place], [place] [address] Phone: [phone] [email or web address] Facebook  [employer]

## 2022-06-06 NOTE — Progress Notes (Signed)
Physical Therapy Treatment Patient Details Name: Dana Blake MRN: [id number] DOB: Nov 23, 1926 Today's Date: 06/06/2022   History of Present Illness 88 y.o. female with medical history significant of hypertension anemia, depression who presented after a mechanical fall.  She lives in an independent living facility, [facility] screen.  She was found to have left hip fracture. Pt s/p L hip hemiarthroplasty 06/05/22    PT Comments    Pt's family requested BID treatment today hoping pt can d/c back to [facility] apt with family providing 24 hr supervision and HHPT/OT services. Pt was a little impulsive this afternoon and reported increased left hip pain. She received dilantin per the family and reported feeling "drunk" and her pain in the hip did not improve. Pt will continue to benefit from acute skilled PT to maximize mobility and independence.   Recommendations for follow up therapy are one component of a multi-disciplinary discharge planning process, led by the attending physician.  Recommendations may be updated based on patient status, additional functional criteria and insurance authorization.  Follow Up Recommendations  Follow physician's recommendations for discharge plan and follow up therapies Can patient physically be transported by private vehicle: Yes   Assistance Recommended at Discharge Frequent or constant Supervision/Assistance  Patient can return home with the following A little help with bathing/dressing/bathroom;A little help with walking and/or transfers;Assist for transportation;Help with stairs or ramp for entrance   Equipment Recommendations  Rolling walker (2 wheels)    Recommendations for Other Services       Precautions / Restrictions Precautions Precautions: Fall;Anterior Hip Restrictions Weight Bearing Restrictions: No     Mobility  Bed Mobility Overal bed mobility: Needs Assistance Bed Mobility: Supine to Sit     Supine to sit: Min assist, HOB  elevated     General bed mobility comments: cues for technique    Transfers Overall transfer level: Needs assistance Equipment used: Rolling walker (2 wheels) Transfers: Sit to/from Stand Sit to Stand: Min assist           General transfer comment: cues for hand placement    Ambulation/Gait Ambulation/Gait assistance: Min assist Gait Distance (Feet): 150 Feet Assistive device: Rolling walker (2 wheels) Gait Pattern/deviations: Step-through pattern, Decreased stride length, Trunk flexed Gait velocity: decr     General Gait Details: pt more impulsive this afternoon suspect due to medication effects.   Stairs             Wheelchair Mobility    Modified Rankin (Stroke Patients Only)       Balance Overall balance assessment: Mild deficits observed, not formally tested                                          Cognition Arousal/Alertness: Awake/alert Behavior During Therapy: Impulsive Overall Cognitive Status: Within Functional Limits for tasks assessed                                 General Comments: Family present-dtr reported they just gave her dilatantin. pt was more impulsive and repported feeling drunk and the pain was still present.        Exercises Total Joint Exercises Ankle Circles/Pumps: AROM, Both, 10 reps, Seated Quad Sets: AROM, Strengthening, Left, 10 reps, Seated Heel Slides: AAROM, Strengthening, Left, 10 reps, Supine Hip ABduction/ADduction: AAROM, Strengthening, Left, 10 reps, Supine Long  Arc Quad: AROM, Strengthening, Left, 10 reps, Seated    General Comments        Pertinent Vitals/Pain Pain Assessment Pain Assessment: 0-10 Pain Score: 6  Pain Location: L hip Pain Descriptors / Indicators: Aching, Discomfort Pain Intervention(s): Limited activity within patient's tolerance, Monitored during session, Premedicated before session    Home Living Family/patient expects to be discharged to::  Assisted living Living Arrangements: Alone               Home Equipment: Conservation officer, nature (2 wheels)      Prior Function            PT Goals (current goals can now be found in the care plan section) Acute Rehab PT Goals Patient Stated Goal: The dance again PT Goal Formulation: With patient Time For Goal Achievement: 06/12/22 Potential to Achieve Goals: Good Progress towards PT goals: Progressing toward goals    Frequency    7X/week      PT Plan Current plan remains appropriate    Co-evaluation              AM-PAC PT "6 Clicks" Mobility   Outcome Measure  Help needed turning from your back to your side while in a flat bed without using bedrails?: A Little Help needed moving from lying on your back to sitting on the side of a flat bed without using bedrails?: A Little Help needed moving to and from a bed to a chair (including a wheelchair)?: A Little Help needed standing up from a chair using your arms (e.g., wheelchair or bedside chair)?: A Little Help needed to walk in hospital room?: A Little Help needed climbing 3-5 steps with a railing? : A Little 6 Click Score: 18    End of Session Equipment Utilized During Treatment: Gait belt Activity Tolerance: Patient limited by pain Patient left: in bed;with bed alarm set;with call bell/phone within reach;with family/visitor present Nurse Communication: Mobility status PT Visit Diagnosis: Other abnormalities of gait and mobility (R26.89);Muscle weakness (generalized) (M62.81)     Time: 1410-1433 PT Time Calculation (min) (ACUTE ONLY): 23 min  Charges:  $Gait Training: 8-22 mins $Therapeutic Exercise: 8-22 mins                        Lelon Mast 06/06/2022, 2:50 PM

## 2022-06-06 NOTE — Progress Notes (Signed)
Received call from [place] regarding Pt HR. Pt working with Pt at that time. Iv dilaudid given after PT session and Pt resting on bed, and no complains of pain after . Pt HR upto 145. Attending Dr [place] Pink notified regarding Pt HR. EKG done, shows sinus rhythm with marked arrhythmia, Received order for metoprolol 25 mg, administered as ordered. Immediate needs addressed. Call bell placed with in reach. Visitor at bedside.

## 2022-06-06 NOTE — TOC Initial Note (Signed)
Transition of Care [facility]) - Initial/Assessment Note    Patient Details  Name: Dana Blake MRN: [id number] Date of Birth: 1927-03-17  Transition of [facility]) CM/SW Contact:    Lennart Pall, LCSW Phone Number: 06/06/2022, 10:24 AM  Clinical Narrative:                 Met with pt and daughter to review possible dc planning needs.  Pt reports she has been living in an [place] apt at [facility] since April 2023 and is eager to return.  They are both aware that therapy may recommend SNF for rehab prior to [place] and are agreeable with that plan if needed.   Currently awaiting therapy evals.  Expected Discharge Plan: Skilled Nursing Facility Barriers to Discharge: Continued Medical Work up, Ship broker, SNF Pending bed offer   Patient Goals and CMS Choice Patient states their goals for this hospitalization and ongoing recovery are:: to [place] following rehab if needed      Expected Discharge Plan and Services Expected Discharge Plan: [place] In-house Referral: Clinical Social Work     Living arrangements for the past 2 months: [place] Armed forces technical officer)                                      Prior Living Arrangements/Services Living arrangements for the past 2 months: [place] Armed forces technical officer) Lives with:: Facility Resident, Self Patient language and need for interpreter reviewed:: Yes Do you feel safe going back to the place where you live?: Yes      Need for Family Participation in Patient Care: No (Comment) Care giver support system in place?: Yes (comment)   Criminal Activity/Legal Involvement Pertinent to Current Situation/Hospitalization: No - Comment as needed  Activities of Daily Living Home Assistive Devices/Equipment: Hearing aid (bilateral hearing aides) ADL Screening (condition at time of admission) Patient's cognitive ability adequate to safely complete daily activities?:  Yes Is the patient deaf or have difficulty hearing?: Yes Does the patient have difficulty seeing, even when wearing glasses/contacts?: Yes Does the patient have difficulty concentrating, remembering, or making decisions?: No Patient able to express need for assistance with ADLs?: Yes Does the patient have difficulty dressing or bathing?: Yes Independently performs ADLs?: No Communication: Independent Dressing (OT): Needs assistance Is this a change from baseline?: Pre-admission baseline Grooming: Independent Feeding: Independent Bathing: Independent Toileting: Needs assistance Is this a change from baseline?: Change from baseline, expected to last >3days In/Out Bed: Needs assistance Is this a change from baseline?: Change from baseline, expected to last >3 days Walks in Home: Dependent Is this a change from baseline?: Change from baseline, expected to last >3 days Does the patient have difficulty walking or climbing stairs?: Yes Weakness of Legs: Left Weakness of Arms/Hands: Left  Permission Sought/Granted Permission sought to share information with : Family Supports, Chartered certified accountant granted to share information with : Yes, Verbal Permission Granted  Share Information with NAME: Synda Bagent     Permission granted to share info w Relationship: daughter  Permission granted to share info w Contact Information: [phone]  Emotional Assessment Appearance:: Appears younger than stated age Attitude/Demeanor/Rapport: Self-Confident, Gracious Affect (typically observed): Accepting Orientation: : Oriented to Self, Oriented to Place, Oriented to  Time, Oriented to Situation Alcohol / Substance Use: Not Applicable Psych Involvement: No (comment)  Admission diagnosis:  Closed left hip  fracture ([place]) [S72.002A] Left displaced femoral neck fracture ([place]) [S72.002A] Fall, initial encounter [W19.XXXA] Closed fracture of neck of left femur, initial encounter  ([place]) [S72.002A] Patient Active Problem List   Diagnosis Date Noted   Closed left hip fracture ([place]) 06/05/2022   Closed left hip fracture, initial encounter ([place]) 06/04/2022   GERD (gastroesophageal reflux disease) 06/04/2022   Burping 02/01/2020   COPD  GOLD I 02/24/2014   Multiple pulmonary nodules c/w MAI clinically  07/29/2013   Cough 07/03/2013   PEPTIC ULCER DISEASE 09/08/2007   Essential hypertension 09/08/2007   PCP:  Lawerance Cruel, MD Pharmacy:   [facility], [place] [address] Phone: [phone] Fax: [phone]     Social Determinants of Health (SDOH) Interventions    Readmission Risk Interventions    06/06/2022   10:21 AM  Readmission Risk Prevention Plan  Transportation Screening Complete  PCP or Specialist Appt within 5-7 Days Complete  Home Care Screening Complete  Medication Review (RN CM) Complete

## 2022-06-06 NOTE — Plan of Care (Signed)

## 2022-06-06 NOTE — Progress Notes (Signed)
[facility] PROGRESS NOTE   Dana Blake XBD:[id number] DOB: 11/18/1926 DOA: 06/04/2022  PCP: Daisy Floro, MD  Brief History/Interval Summary: 88 y.o. female with medical history significant of hypertension anemia, depression who presented after a mechanical fall.  She lives in an independent living facility, [facility] screen.  She was found to have left hip fracture.  She was hospitalized for further management.  Consultants: Orthopedics  Procedures: 9/28: Left hip hemiarthroplasty, anterior approach.     Subjective/Interval History: Patient noted to be much more comfortable this morning.  She mentions that her pain is significantly better than yesterday.  Denies any chest pain shortness of breath.  No nausea or vomiting.  Her daughter is at the bedside.  She    Assessment/Plan:  Left hip fracture Patient underwent left hip hemiarthroplasty on 9/28. Pain is very well controlled.  Bowel regimen has been ordered. PT and OT evaluation is pending.  She may need short-term rehab.  She lives in an independent living facility.    Abnormal EKG EKG at the time of admission suggesting atrial fibrillation. This is likely because it was a poor quality EKG.  P waves were noted before every QRS complex.  PACs were seen.   EKG to be repeated.  TSH is noted to be normal.  Normocytic anemia Hemoglobin stable for the most part.  Essential hypertension Losartan being continued.  Blood pressure is very well controlled.  Actually noted to be somewhat hypotensive over the last 12 hours.  We will hold her losartan for now.  GERD Continue with PPI.  Also noted to be on Pepcid.  History of glaucoma Noted to be on Xalatan eyedrops which have been continued.  DVT Prophylaxis: Definitive prophylaxis after surgery Code Status: Full code Family Communication: Discussed with her son Disposition Plan: To be determined  Status is: Inpatient Remains inpatient appropriate because:  Left hip fracture requiring surgical intervention      Medications: Scheduled:  acetaminophen  500 mg Oral Q6H   aspirin EC  325 mg Oral Daily   docusate sodium  100 mg Oral BID   famotidine  20 mg Oral QHS   feeding supplement  237 mL Oral BID BM   latanoprost  1 drop Both Eyes QHS   losartan  50 mg Oral Daily   pantoprazole  40 mg Oral Daily   polyethylene glycol  17 g Oral Daily   senna  1 tablet Oral BID   Vitamin D (Ergocalciferol)  50,000 Units Oral Q7 days   Continuous:  lactated ringers 75 mL/hr at 06/05/22 1905   methocarbamol (ROBAXIN) IV     STM:[id number], alum & mag hydroxide-simeth, bisacodyl, HYDROcodone-acetaminophen, HYDROcodone-acetaminophen, HYDROmorphone (DILAUDID) injection, menthol-cetylpyridinium **OR** phenol, methocarbamol **OR** methocarbamol (ROBAXIN) IV, metoCLOPramide **OR** metoCLOPramide (REGLAN) injection, morphine injection, ondansetron **OR** ondansetron (ZOFRAN) IV, polyethylene glycol  Antibiotics: Anti-infectives (From admission, onward)    Start     Dose/Rate Route Frequency Ordered Stop   06/05/22 2200  ceFAZolin (ANCEF) IVPB 2g/100 mL premix        2 g 200 mL/hr over 30 Minutes Intravenous Every 6 hours 06/05/22 1858 06/06/22 0412   06/05/22 1355  ceFAZolin (ANCEF) IVPB 2g/100 mL premix        2 g 200 mL/hr over 30 Minutes Intravenous On call to O.R. 06/05/22 1110 06/05/22 1550   06/05/22 1115  ceFAZolin (ANCEF) IVPB 3g/100 mL premix  Status:  Discontinued        3 g 200 mL/hr over 30 Minutes Intravenous  On call to O.R. 06/05/22 1110 06/05/22 1356       Objective:  Vital Signs  Vitals:   06/05/22 2135 06/06/22 0151 06/06/22 0623 06/06/22 0939  BP: (!) 96/51 (!) 102/57 (!) 100/51 (!) 101/40  Pulse: 64 73 73 66  Resp: 17 17 17    Temp: 98.2 F (36.8 C) 97.7 F (36.5 C) (!) 97.4 F (36.3 C) (!) 97.3 F (36.3 C)  TempSrc: Oral  Oral   SpO2: 95% 94% 95% 98%  Weight:      Height:        Intake/Output Summary (Last 24  hours) at 06/06/2022 1054 Last data filed at 06/06/2022 0655 Gross per 24 hour  Intake 3177 ml  Output 1270 ml  Net 1907 ml   Filed Weights   06/05/22 1124 06/05/22 1347  Weight: 52.2 kg 52.1 kg    General appearance: Awake alert.  In no distress Resp: Clear to auscultation bilaterally.  Normal effort Cardio: S1-S2 is normal regular.  No S3-S4.  No rubs murmurs or bruit GI: Abdomen is soft.  Nontender nondistended.  Bowel sounds are present normal.  No masses organomegaly Extremities: No edema.  Restricted range of motion of the left lower extremity due to recent surgery Neurologic: Alert and oriented x3.  No focal neurological deficits.     Lab Results:  Data Reviewed: I have personally reviewed following labs and reports of the imaging studies  CBC: Recent Labs  Lab 06/04/22 1530 06/06/22 0336  WBC 9.0 10.0  NEUTROABS 6.7  --   HGB 12.8 11.5*  HCT 39.6 37.0  MCV 88.6 91.4  PLT 313 199     Basic Metabolic Panel: Recent Labs  Lab 06/04/22 1530 06/06/22 0336  NA 139 136  K 4.4 3.6  CL 106 103  CO2 26 28  GLUCOSE 102* 105*  BUN 16 17  CREATININE 0.57 0.71  CALCIUM 8.8* 8.2*     GFR: Estimated Creatinine Clearance: 36.1 mL/min (by C-G formula based on SCr of 0.71 mg/dL).   Cardiac Enzymes: Recent Labs  Lab 06/04/22 1530  CKTOTAL 74      Thyroid Function Tests: Recent Labs    06/05/22 0449  TSH 1.178     Radiology Studies: Pelvis Portable  Result Date: 06/05/2022 CLINICAL DATA:  Left hip arthroplasty. EXAM: PORTABLE PELVIS 1-2 VIEWS COMPARISON:  None Available. FINDINGS: Left hip hemiarthroplasty appears in anatomic alignment. No evidence for loosening or fracture. Lateral left hip soft tissue swelling, air and skin staples compatible with recent surgery. IMPRESSION: Left hip hemiarthroplasty appears in anatomic alignment. Electronically Signed   By: Ronney Asters M.D.   On: 06/05/2022 19:17   DG HIP UNILAT WITH PELVIS 1V LEFT  Result Date:  06/05/2022 CLINICAL DATA:  Intraoperative left hip arthroplasty EXAM: DG HIP (WITH OR WITHOUT PELVIS) 1V*L* COMPARISON:  Left hip x-ray 06/04/2022 FINDINGS: Intraoperative left hip. Four low resolution intraoperative spot views of the left hip were obtained. Left hip total arthroplasty was placed. Alignment is anatomic. Total fluoroscopy time: 3 seconds Total radiation dose: 0.28 micro Gy IMPRESSION: Intraoperative left hip total arthroplasty. Electronically Signed   By: Ronney Asters M.D.   On: 06/05/2022 17:40   DG C-Arm 1-60 Min-No Report  Result Date: 06/05/2022 Fluoroscopy was utilized by the requesting physician.  No radiographic interpretation.   DG Knee Left Port  Result Date: 06/05/2022 CLINICAL DATA:  88 year old female with history of closed displaced fracture of the left femoral neck. EXAM: PORTABLE LEFT KNEE - 1-2 VIEW  COMPARISON:  No priors. FINDINGS: Two views of the left knee demonstrate no acute displaced fracture, subluxation or dislocation. Mild joint space narrowing, subchondral sclerosis and osteophyte formation is noted, indicative of osteoarthritis. Chondrocalcinosis is also noted. IMPRESSION: 1. No acute radiographic abnormality of the left knee. 2. Chondrocalcinosis. Electronically Signed   By: Trudie Reed M.D.   On: 06/05/2022 07:03   DG CHEST PORT 1 VIEW  Result Date: 06/04/2022 CLINICAL DATA:  Preop exam EXAM: PORTABLE CHEST 1 VIEW COMPARISON:  Chest 03/16/2022 FINDINGS: The heart size and mediastinal contours are within normal limits. Both lungs are clear. The visualized skeletal structures are unremarkable. IMPRESSION: No active disease. Electronically Signed   By: Marlan Palau M.D.   On: 06/04/2022 20:00   CT Hip Left Wo Contrast  Result Date: 06/04/2022 CLINICAL DATA:  Left hip pain after fall. EXAM: CT OF THE LEFT HIP WITHOUT CONTRAST TECHNIQUE: Multidetector CT imaging of the left hip was performed according to the standard protocol. Multiplanar CT image  reconstructions were also generated. RADIATION DOSE REDUCTION: This exam was performed according to the departmental dose-optimization program which includes automated exposure control, adjustment of the mA and/or kV according to patient size and/or use of iterative reconstruction technique. COMPARISON:  Left hip x-rays from same day. FINDINGS: Bones/Joint/Cartilage Acute nondisplaced subcapital left femoral neck fracture. No additional fracture. No dislocation. Bilateral hip degenerative changes with chondrocalcinosis. Osteopenia. No joint effusion. Ligaments Ligaments are suboptimally evaluated by CT. Muscles and Tendons Grossly intact. Soft tissue No fluid collection or hematoma.  No soft tissue mass. IMPRESSION: 1. Acute nondisplaced subcapital left femoral neck fracture. Electronically Signed   By: Obie Dredge M.D.   On: 06/04/2022 17:30   DG Hip Unilat W or Wo Pelvis 2-3 Views Left  Result Date: 06/04/2022 CLINICAL DATA:  08/02/2010 EXAM: DG HIP (WITH OR WITHOUT PELVIS) 2-3V LEFT COMPARISON:  08/02/2010 FINDINGS: There is no evidence of hip fracture or dislocation. There is no evidence of arthropathy or other focal bone abnormality. IMPRESSION: Negative. Electronically Signed   By: Signa Kell M.D.   On: 06/04/2022 16:48       LOS: 2 days   Adelayde Minney Rito Ehrlich  [facility] Pager on [email or web address]  06/06/2022, 10:54 AM

## 2022-06-07 DIAGNOSIS — R9431 Abnormal electrocardiogram [ECG] [EKG]: Secondary | ICD-10-CM | POA: Diagnosis not present

## 2022-06-07 DIAGNOSIS — I10 Essential (primary) hypertension: Secondary | ICD-10-CM | POA: Diagnosis not present

## 2022-06-07 DIAGNOSIS — S72002A Fracture of unspecified part of neck of left femur, initial encounter for closed fracture: Secondary | ICD-10-CM | POA: Diagnosis not present

## 2022-06-07 LAB — CBC
HCT: 32.3 % — ABNORMAL LOW (ref 36.0–46.0)
Hemoglobin: 10.4 g/dL — ABNORMAL LOW (ref 12.0–15.0)
MCH: 28.8 pg (ref 26.0–34.0)
MCHC: 32.2 g/dL (ref 30.0–36.0)
MCV: 89.5 fL (ref 80.0–100.0)
Platelets: 218 10*3/uL (ref 150–400)
RBC: 3.61 MIL/uL — ABNORMAL LOW (ref 3.87–5.11)
RDW: 14.3 % (ref 11.5–15.5)
WBC: 13.2 10*3/uL — ABNORMAL HIGH (ref 4.0–10.5)
nRBC: 0 % (ref 0.0–0.2)

## 2022-06-07 LAB — BASIC METABOLIC PANEL
Anion gap: 6 (ref 5–15)
BUN: 19 mg/dL (ref 8–23)
CO2: 28 mmol/L (ref 22–32)
Calcium: 8.2 mg/dL — ABNORMAL LOW (ref 8.9–10.3)
Chloride: 101 mmol/L (ref 98–111)
Creatinine, Ser: 0.73 mg/dL (ref 0.44–1.00)
GFR, Estimated: >60 mL/min (ref >60)
Glucose, Bld: 94 mg/dL (ref 70–99)
Potassium: 4.1 mmol/L (ref 3.5–5.1)
Sodium: 135 mmol/L (ref 135–145)

## 2022-06-07 NOTE — Progress Notes (Signed)
Physical Therapy Treatment Patient Details Name: Dana Blake MRN: [id number] DOB: [birth year] Today's Date: 06/07/2022   History of Present Illness 88 y.o. female with medical history significant of hypertension anemia, depression who presented after a mechanical fall.  She lives in an independent living facility, [facility] screen.  She was found to have left hip fracture. Pt s/p L hip hemiarthroplasty 06/05/22    PT Comments    POD # 1 am session General Comments: AxO x 3 pleasant Lady and following all instructions.  Pt is leagally blind and requires increased instruction for safety and navigating unfamiliar environment.  Assisted OOB to amb to bathroom then in hallway required increased time.  General transfer comment: 75% VC's on safety with turns and environment due to low vision.  Also assisted with a toilet transfer.  Unsteady with turns and back steps.  Decreased weight shift to L LE.  HIGH FALL RISK. General Gait Details: decreased amb distance due to increased c/p pain/fatigue.  Present with gait instability with decreased weightshift onto L LE.  50% VC's on proper walker to self distance esp with turns.  Prior to Fx, pt did NOT use any AD.  HIGH FALL RISK. Pt resides at [facility] and was Ind amb without any AD prior to her fall.  Pt will need ST Rehab at SNF to address mobility and functional decline prior to safely returning home.   Recommendations for follow up therapy are one component of a multi-disciplinary discharge planning process, led by the attending physician.  Recommendations may be updated based on patient status, additional functional criteria and insurance authorization.  Follow Up Recommendations  Follow physician's recommendations for discharge plan and follow up therapies Can patient physically be transported by private vehicle: Yes   Assistance Recommended at Discharge Frequent or constant Supervision/Assistance  Patient can return home with the  following A little help with bathing/dressing/bathroom;A little help with walking and/or transfers;Assist for transportation;Help with stairs or ramp for entrance   Equipment Recommendations  Rolling walker (2 wheels)    Recommendations for Other Services       Precautions / Restrictions Precautions Precautions: Fall;Anterior Hip Restrictions Weight Bearing Restrictions: No LLE Weight Bearing: Weight bearing as tolerated     Mobility  Bed Mobility Overal bed mobility: Needs Assistance Bed Mobility: Supine to Sit     Supine to sit: Min guard, Min assist     General bed mobility comments: cues for technique plus increased time.    Transfers Overall transfer level: Needs assistance Equipment used: Rolling walker (2 wheels) Transfers: Sit to/from Stand Sit to Stand: Min assist           General transfer comment: 75% VC's on safety with turns and environment due to low vision.  Also assisted with a toilet transfer.  Unsteady with turns and back steps.  Decreased weight shift to L LE.  HIGH FALL RISK.    Ambulation/Gait Ambulation/Gait assistance: Min assist Gait Distance (Feet): 75 Feet Assistive device: Rolling walker (2 wheels) Gait Pattern/deviations: Step-through pattern, Decreased stride length, Trunk flexed Gait velocity: decreased     General Gait Details: decreased amb distance due to increased c/p pain/fatigue.  Present with gait instability with decreased weightshift onto L LE.  50% VC's on proper walker to self distance esp with turns.  Prior to Fx, pt did NOT use any AD.  HIGH FALL RISK.   Stairs             Psychologist, prison and probation services  Modified Rankin (Stroke Patients Only)       Balance                                            Cognition Arousal/Alertness: Awake/alert Behavior During Therapy: Impulsive Overall Cognitive Status: Within Functional Limits for tasks assessed                                  General Comments: AxO x 3 pleasant Lady and following all instructions.  Pt is leagally blind and requires increased instruction for safety and navigating unfamiliar environment.        Exercises      General Comments        Pertinent Vitals/Pain Pain Assessment Pain Assessment: 0-10 Pain Score: 5  Pain Location: L hip Pain Descriptors / Indicators: Aching, Discomfort, Operative site guarding, Tender Pain Intervention(s): Monitored during session, Premedicated before session, Repositioned, Ice applied    Home Living                          Prior Function            PT Goals (current goals can now be found in the care plan section) Progress towards PT goals: Progressing toward goals    Frequency    7X/week      PT Plan Current plan remains appropriate    Co-evaluation              AM-PAC PT "6 Clicks" Mobility   Outcome Measure  Help needed turning from your back to your side while in a flat bed without using bedrails?: A Little Help needed moving from lying on your back to sitting on the side of a flat bed without using bedrails?: A Little Help needed moving to and from a bed to a chair (including a wheelchair)?: A Little Help needed standing up from a chair using your arms (e.g., wheelchair or bedside chair)?: A Lot Help needed to walk in hospital room?: A Lot Help needed climbing 3-5 steps with a railing? : Total 6 Click Score: 14    End of Session Equipment Utilized During Treatment: Gait belt Activity Tolerance: Patient limited by fatigue;No increased pain Patient left: in chair;with call bell/phone within reach;with chair alarm set Nurse Communication: Mobility status PT Visit Diagnosis: Other abnormalities of gait and mobility (R26.89);Muscle weakness (generalized) (M62.81)     Time: 0981-1914 PT Time Calculation (min) (ACUTE ONLY): 23 min  Charges:  $Gait Training: 8-22 mins $Therapeutic Activity: 8-22 mins                      {Marrissa Dai  PTA Acute  Rehabilitation Services Office M-F          [phone] Weekend pager [phone]

## 2022-06-07 NOTE — Plan of Care (Signed)
  Problem: Coping: Goal: Level of anxiety will decrease Outcome: Progressing   Problem: Pain Managment: Goal: General experience of comfort will improve Outcome: Progressing   Problem: Safety: Goal: Ability to remain free from injury will improve Outcome: Progressing   

## 2022-06-07 NOTE — NC FL2 (Signed)
[place] MEDICAID FL2 LEVEL OF CARE SCREENING TOOL     IDENTIFICATION  Patient Name: Dana Blake Birthdate: 02-May-1927 Sex: female Admission Date (Current Location): 06/04/2022  [facility] and [place] Number:  Producer, television/film/video and Address:  [facility],  [address] [place], [place] [address]      Provider Number: [id number]  Attending Physician Name and Address:  Osvaldo Shipper, MD  Relative Name and Phone Number:  Libi, Corso (Daughter)   [phone]    Current Level of Care: Hospital Recommended Level of Care: Skilled Nursing Facility Prior Approval Number:    Date Approved/Denied:   PASRR Number: [id number]  Discharge Plan: SNF    Current Diagnoses: Patient Active Problem List   Diagnosis Date Noted   Malnutrition of moderate degree 06/06/2022   Closed left hip fracture (HCC) 06/05/2022   Closed left hip fracture, initial encounter (HCC) 06/04/2022   GERD (gastroesophageal reflux disease) 06/04/2022   Burping 02/01/2020   COPD  GOLD I 02/24/2014   Multiple pulmonary nodules c/w MAI clinically  07/29/2013   Cough 07/03/2013   PEPTIC ULCER DISEASE 09/08/2007   Essential hypertension 09/08/2007    Orientation RESPIRATION BLADDER Height & Weight     Self, Time, Situation, Place  Normal Continent Weight: 52.1 kg Height:  5\' 4"  (162.6 cm)  BEHAVIORAL SYMPTOMS/MOOD NEUROLOGICAL BOWEL NUTRITION STATUS      Continent Diet (regular)  AMBULATORY STATUS COMMUNICATION OF NEEDS Skin   Limited Assist Verbally Surgical wounds                       Personal Care Assistance Level of Assistance  Bathing, Feeding, Dressing Bathing Assistance: Limited assistance Feeding assistance: Independent Dressing Assistance: Limited assistance     Functional Limitations Info  Sight, Hearing, Speech Sight Info: Impaired Hearing Info: Adequate Speech Info: Adequate    SPECIAL CARE FACTORS FREQUENCY  PT (By licensed PT), OT (By licensed OT)     PT  Frequency: 5x/week OT Frequency: 5x/week            Contractures Contractures Info: Not present    Additional Factors Info  Code Status, Allergies Code Status Info: full Allergies Info: Neo-synephrine (Oxymetazoline)   Prednisone (Neo-synephrine (Oxymetazoline)   Prednisone)           Current Medications (06/07/2022):  This is the current hospital active medication list Current Facility-Administered Medications  Medication Dose Route Frequency Provider Last Rate Last Admin   acetaminophen (TYLENOL) tablet 325-650 mg  325-650 mg Oral Q6H PRN Hill, 06/09/2022, PA-C       alum & mag hydroxide-simeth (MAALOX/MYLANTA) 200-200-20 MG/5ML suspension 30 mL  30 mL Oral Q4H PRN 03-17-2001, PA-C       aspirin EC tablet 325 mg  325 mg Oral Daily Clois Dupes, Clois Dupes   325 mg at 06/07/22 06/09/22   bisacodyl (DULCOLAX) suppository 10 mg  10 mg Rectal Daily PRN 8469 S, PA-C       docusate sodium (COLACE) capsule 100 mg  100 mg Oral BID M S, PA-C   100 mg at 06/07/22 0934   famotidine (PEPCID) tablet 20 mg  20 mg Oral QHS Doutova, 06/09/22, MD   20 mg at 06/06/22 2051   feeding supplement (ENSURE ENLIVE / ENSURE PLUS) liquid 237 mL  237 mL Oral BID BM 2052, MD   237 mL at 06/07/22 1410   HYDROcodone-acetaminophen (NORCO) 7.5-325 MG per tablet 1-2 tablet  1-2 tablet Oral  Q4H PRN Charlott Rakes, PA-C       HYDROcodone-acetaminophen (NORCO/VICODIN) 5-325 MG per tablet 1-2 tablet  1-2 tablet Oral Q4H PRN Charlott Rakes, PA-C   2 tablet at 06/07/22 1514   HYDROmorphone (DILAUDID) injection 0.5 mg  0.5 mg Intravenous Q3H PRN Toy Baker, MD   0.5 mg at 06/06/22 1309   latanoprost (XALATAN) 0.005 % ophthalmic solution 1 drop  1 drop Both Eyes QHS Doutova, Anastassia, MD   1 drop at 06/06/22 2054   menthol-cetylpyridinium (CEPACOL) lozenge 3 mg  1 lozenge Oral PRN Charlott Rakes, PA-C       Or   phenol (CHLORASEPTIC) mouth spray 1 spray  1 spray Mouth/Throat PRN Hill, Marciano Sequin,  PA-C       methocarbamol (ROBAXIN) tablet 500 mg  500 mg Oral Q6H PRN Charlott Rakes, PA-C   500 mg at 06/06/22 2051   Or   methocarbamol (ROBAXIN) 500 mg in dextrose 5 % 50 mL IVPB  500 mg Intravenous Q6H PRN Hill, Avery S, PA-C       metoCLOPramide (REGLAN) tablet 5-10 mg  5-10 mg Oral Q8H PRN Hill, Avery S, PA-C       Or   metoCLOPramide (REGLAN) injection 5-10 mg  5-10 mg Intravenous Q8H PRN Hill, Naida Sleight S, PA-C       metoprolol tartrate (LOPRESSOR) tablet 25 mg  25 mg Oral BID Bonnielee Haff, MD   25 mg at 06/07/22 0945   morphine (PF) 2 MG/ML injection 0.5-1 mg  0.5-1 mg Intravenous Q2H PRN Hill, Naida Sleight S, PA-C       ondansetron (ZOFRAN) tablet 4 mg  4 mg Oral Q6H PRN Hill, Avery S, PA-C       Or   ondansetron (ZOFRAN) injection 4 mg  4 mg Intravenous Q6H PRN Hill, Naida Sleight S, PA-C   4 mg at 06/06/22 1441   pantoprazole (PROTONIX) EC tablet 40 mg  40 mg Oral Daily Doutova, Anastassia, MD   40 mg at 06/07/22 0934   polyethylene glycol (MIRALAX / GLYCOLAX) packet 17 g  17 g Oral Daily Bonnielee Haff, MD   17 g at 06/07/22 0934   polyethylene glycol (MIRALAX / GLYCOLAX) packet 17 g  17 g Oral Daily PRN Charlott Rakes, PA-C   17 g at 06/06/22 0615   senna (SENOKOT) tablet 8.6 mg  1 tablet Oral BID Larene Pickett S, PA-C   8.6 mg at 06/07/22 6967   Vitamin D (Ergocalciferol) (DRISDOL) 1.25 MG (50000 UNIT) capsule 50,000 Units  50,000 Units Oral Q7 days Bonnielee Haff, MD   50,000 Units at 06/06/22 1002     Discharge Medications: Please see discharge summary for a list of discharge medications.  Relevant Imaging Results:  Relevant Lab Results:   Additional Information SSN [id number]  Henrietta Dine, RN

## 2022-06-07 NOTE — TOC Progression Note (Signed)
Transition of Care [facility]) - Progression Note    Patient Details  Name: Dana Blake MRN: [id number] Date of Birth: 11-07-1926  [facility] CM/SW Contact  Henrietta Dine, RN Phone Number: 06/07/2022, 4:27 PM  Clinical Narrative:  spoke with Brunetta Genera (dtr) [phone]; they are agreeable to SNF; their preferences are 1st [facility], 2nd [facility], and 3rd [facility]; FL2 sent; awaiting bed offers.    Expected Discharge Plan: [place] Barriers to Discharge: Continued Medical Work up  Expected Discharge Plan and Services Expected Discharge Plan: [place] In-house Referral: Clinical Social Work     Living arrangements for the past 2 months: [place] ([facility])                                       Social Determinants of Health (SDOH) Interventions    Readmission Risk Interventions    06/06/2022   10:21 AM  Readmission Risk Prevention Plan  Transportation Screening Complete  PCP or Specialist Appt within 5-7 Days Complete  Home Care Screening Complete  Medication Review (RN CM) Complete

## 2022-06-07 NOTE — Plan of Care (Signed)
  Problem: Education: Goal: Knowledge of General Education information will improve Description: Including pain rating scale, medication(s)/side effects and non-pharmacologic comfort measures Outcome: Progressing   Problem: Pain Managment: Goal: General experience of comfort will improve Outcome: Progressing   Problem: Safety: Goal: Ability to remain free from injury will improve Outcome: Progressing   

## 2022-06-07 NOTE — Progress Notes (Signed)
Physical Therapy Treatment Patient Details Name: Dana Blake MRN: [id number] DOB: August 30, 1927 Today's Date: 06/07/2022   History of Present Illness 88 y.o. female with medical history significant of hypertension anemia, depression who presented after a mechanical fall.  She lives in an independent living facility, [facility] screen.  She was found to have left hip fracture. Pt s/p L hip hemiarthroplasty 06/05/22    PT Comments    POD # 2 pm session Daughter and Granddaughter in room visiting.  Assisted OOB to amb to bathroom then in hallway required increased time and effort.  General transfer comment: 75% VC's on safety with turns and environment due to low vision.  Also assisted with a toilet transfer.  Unsteady with turns and back steps.  Decreased weight shift to L LE.  HIGH FALL RISK. General Gait Details: decreased amb distance due to increased c/p pain/fatigue.  Present with gait instability with decreased weightshift onto L LE.  50% VC's on proper walker to self distance esp with turns.  Prior to Fx, pt did NOT use any AD.  HIGH FALL RISK. Pt will need ST Rehab at SNF to address mobility and functional decline prior to safely returning home.   Recommendations for follow up therapy are one component of a multi-disciplinary discharge planning process, led by the attending physician.  Recommendations may be updated based on patient status, additional functional criteria and insurance authorization.  Follow Up Recommendations  Follow physician's recommendations for discharge plan and follow up therapies Can patient physically be transported by private vehicle: Yes   Assistance Recommended at Discharge Frequent or constant Supervision/Assistance  Patient can return home with the following A little help with bathing/dressing/bathroom;A little help with walking and/or transfers;Assist for transportation;Help with stairs or ramp for entrance   Equipment Recommendations  Rolling walker (2  wheels)    Recommendations for Other Services       Precautions / Restrictions Precautions Precautions: Fall;Anterior Hip Restrictions Weight Bearing Restrictions: No LLE Weight Bearing: Weight bearing as tolerated     Mobility  Bed Mobility Overal bed mobility: Needs Assistance Bed Mobility: Supine to Sit, Sit to Supine     Supine to sit: Min guard, Min assist Sit to supine: Min assist   General bed mobility comments: cues for technique plus increased time.    Transfers Overall transfer level: Needs assistance Equipment used: Rolling walker (2 wheels) Transfers: Sit to/from Stand Sit to Stand: Min assist           General transfer comment: 75% VC's on safety with turns and environment due to low vision.  Also assisted with a toilet transfer.  Unsteady with turns and back steps.  Decreased weight shift to L LE.  HIGH FALL RISK.    Ambulation/Gait Ambulation/Gait assistance: Min assist Gait Distance (Feet): 45 Feet Assistive device: Rolling walker (2 wheels) Gait Pattern/deviations: Step-through pattern, Decreased stride length, Trunk flexed Gait velocity: decreased     General Gait Details: decreased amb distance due to increased c/p pain/fatigue.  Present with gait instability with decreased weightshift onto L LE.  50% VC's on proper walker to self distance esp with turns.  Prior to Fx, pt did NOT use any AD.  HIGH FALL RISK.   Stairs             Wheelchair Mobility    Modified Rankin (Stroke Patients Only)       Balance  Cognition Arousal/Alertness: Awake/alert Behavior During Therapy: Impulsive Overall Cognitive Status: Within Functional Limits for tasks assessed                                 General Comments: AxO x 3 pleasant Lady and following all instructions.  Pt is leagally blind and requires increased instruction for safety and navigating unfamiliar  environment.        Exercises      General Comments        Pertinent Vitals/Pain Pain Assessment Pain Assessment: Faces Pain Score: 5  Faces Pain Scale: Hurts even more Pain Location: L hip Pain Descriptors / Indicators: Aching, Discomfort, Operative site guarding, Tender Pain Intervention(s): Monitored during session, Patient requesting pain meds-RN notified, Repositioned    Home Living                          Prior Function            PT Goals (current goals can now be found in the care plan section) Progress towards PT goals: Progressing toward goals    Frequency    7X/week      PT Plan Current plan remains appropriate    Co-evaluation              AM-PAC PT "6 Clicks" Mobility   Outcome Measure  Help needed turning from your back to your side while in a flat bed without using bedrails?: A Little Help needed moving from lying on your back to sitting on the side of a flat bed without using bedrails?: A Little Help needed moving to and from a bed to a chair (including a wheelchair)?: A Little Help needed standing up from a chair using your arms (e.g., wheelchair or bedside chair)?: A Lot Help needed to walk in hospital room?: A Lot Help needed climbing 3-5 steps with a railing? : Total 6 Click Score: 14    End of Session Equipment Utilized During Treatment: Gait belt Activity Tolerance: Patient limited by fatigue;Patient limited by pain Patient left: in bed;with call bell/phone within reach;with bed alarm set Nurse Communication: Mobility status PT Visit Diagnosis: Other abnormalities of gait and mobility (R26.89);Muscle weakness (generalized) (M62.81)     Time: 3536-1443 PT Time Calculation (min) (ACUTE ONLY): 26 min  Charges:  $Gait Training: 8-22 mins $Therapeutic Activity: 8-22 mins                     Rica Koyanagi  PTA [facility] M-F          [phone] Weekend pager [phone]

## 2022-06-07 NOTE — Progress Notes (Signed)
[facility] PROGRESS NOTE   PRISTINE GLADHILL KKX:[id number] DOB: December 22, 1926 DOA: 06/04/2022  PCP: Daisy Floro, MD  Brief History/Interval Summary: 88 y.o. female with medical history significant of hypertension anemia, depression who presented after a mechanical fall.  She lives in an independent living facility, [facility] screen.  She was found to have left hip fracture.  She was hospitalized for further management.  Consultants: Orthopedics  Procedures: 9/28: Left hip hemiarthroplasty, anterior approach.     Subjective/Interval History: Patient denies any complaints this morning.  Daughter is at the bedside.  Patient mentions that the pain in the left hip area is well controlled.  No chest pain shortness of breath nausea or vomiting.      Assessment/Plan:  Left hip fracture Patient underwent left hip hemiarthroplasty on 9/28. Patient is stable from medical standpoint.  Pain is very well controlled.  Bowel regimen has been ordered. Pain is very well controlled.  Bowel regimen has been ordered. Seen by physical therapy.  Family unsure if she needs to go back to her independent living facility or to go to rehab.  After further discussions it appears that they want to pursue short-term rehab to give her the maximum chance of getting back to her baseline.  This seems to be reasonable at this time.    Sinus arrhythmia with tachyarrhythmia of unclear etiology EKG at the time of admission was read by the computer as atrial fibrillation.  However it was a very poor quality EKG.  EKG was repeated and shows sinus rhythm with sinus arrhythmia. On 9/29 after she worked with physical therapy she became tachycardic.  Rate was in the 140s and rhythm was somewhat irregular. She was given beta-blocker after which she slowed down and was noted to be again in sinus rhythm with sinus arrhythmia Rhythm strips were reviewed with cardiology.  EKGs were also reviewed.  Atrial fibrillation  cannot be absolutely ruled out. All of these issues were discussed with patient and her daughter.  She would be considered high risk if she is placed on anticoagulation due to advanced age, frequent falls at home and history of GI bleed in the past.  Patient and family at this time do not want anticoagulation.  In view of this we will not pursue this any further.  TSH was noted to be normal. No need to pursue echocardiogram.  No need to pursue 30-day monitor.  Heart rate has been stable ever since she was placed on low-dose metoprolol yesterday which will be continued.    Normocytic anemia Hemoglobin stable for the most part.  Leukocytosis is likely reactive.  Essential hypertension Beta-blocker was added yesterday for heart rate issues as discussed above.  Losartan was held yesterday due to borderline low blood pressures.  Stable this morning.  GERD Continue with PPI.  Also noted to be on Pepcid.  History of glaucoma Noted to be on Xalatan eyedrops which have been continued.  DVT Prophylaxis: Aspirin per orthopedics Code Status: Full code Family Communication: Discussed with her daughter Disposition Plan: SNF for short-term rehab  Status is: Inpatient Remains inpatient appropriate because: Left hip fracture requiring surgical intervention      Medications: Scheduled:  aspirin EC  325 mg Oral Daily   docusate sodium  100 mg Oral BID   famotidine  20 mg Oral QHS   feeding supplement  237 mL Oral BID BM   latanoprost  1 drop Both Eyes QHS   metoprolol tartrate  25 mg Oral BID  pantoprazole  40 mg Oral Daily   polyethylene glycol  17 g Oral Daily   senna  1 tablet Oral BID   Vitamin D (Ergocalciferol)  50,000 Units Oral Q7 days   Continuous:  methocarbamol (ROBAXIN) IV     IEP:PIRJJOACZYSAY, alum & mag hydroxide-simeth, bisacodyl, HYDROcodone-acetaminophen, HYDROcodone-acetaminophen, HYDROmorphone (DILAUDID) injection, menthol-cetylpyridinium **OR** phenol, methocarbamol  **OR** methocarbamol (ROBAXIN) IV, metoCLOPramide **OR** metoCLOPramide (REGLAN) injection, morphine injection, ondansetron **OR** ondansetron (ZOFRAN) IV, polyethylene glycol  Antibiotics: Anti-infectives (From admission, onward)    Start     Dose/Rate Route Frequency Ordered Stop   06/05/22 2200  ceFAZolin (ANCEF) IVPB 2g/100 mL premix        2 g 200 mL/hr over 30 Minutes Intravenous Every 6 hours 06/05/22 1858 06/06/22 0412   06/05/22 1355  ceFAZolin (ANCEF) IVPB 2g/100 mL premix        2 g 200 mL/hr over 30 Minutes Intravenous On call to O.R. 06/05/22 1110 06/05/22 1550   06/05/22 1115  ceFAZolin (ANCEF) IVPB 3g/100 mL premix  Status:  Discontinued        3 g 200 mL/hr over 30 Minutes Intravenous On call to O.R. 06/05/22 1110 06/05/22 1356       Objective:  Vital Signs  Vitals:   06/06/22 1612 06/06/22 2302 06/07/22 0635 06/07/22 0945  BP: 126/68 (!) 86/39 98/79 120/73  Pulse: 72 74 75 67  Resp: 16 18 18    Temp: 98.2 F (36.8 C) 98.6 F (37 C) 98.7 F (37.1 C)   TempSrc: Oral Oral Oral   SpO2: 98% 97% 98%   Weight:      Height:        Intake/Output Summary (Last 24 hours) at 06/07/2022 1049 Last data filed at 06/07/2022 0957 Gross per 24 hour  Intake 600 ml  Output --  Net 600 ml    Filed Weights   06/05/22 1124 06/05/22 1347  Weight: 52.2 kg 52.1 kg    General appearance: Awake alert.  In no distress Resp: Clear to auscultation bilaterally.  Normal effort Cardio: S1-S2 is normal regular.  No S3-S4.  No rubs murmurs or bruit GI: Abdomen is soft.  Nontender nondistended.  Bowel sounds are present normal.  No masses organomegaly    Lab Results:  Data Reviewed: I have personally reviewed following labs and reports of the imaging studies  CBC: Recent Labs  Lab 06/04/22 1530 06/06/22 0336 06/07/22 0429  WBC 9.0 10.0 13.2*  NEUTROABS 6.7  --   --   HGB 12.8 11.5* 10.4*  HCT 39.6 37.0 32.3*  MCV 88.6 91.4 89.5  PLT 313 199 218     Basic  Metabolic Panel: Recent Labs  Lab 06/04/22 1530 06/06/22 0336 06/07/22 0429  NA 139 136 135  K 4.4 3.6 4.1  CL 106 103 101  CO2 26 28 28   GLUCOSE 102* 105* 94  BUN 16 17 19   CREATININE 0.57 0.71 0.73  CALCIUM 8.8* 8.2* 8.2*     GFR: Estimated Creatinine Clearance: 36.1 mL/min (by C-G formula based on SCr of 0.73 mg/dL).   Cardiac Enzymes: Recent Labs  Lab 06/04/22 1530  CKTOTAL 74      Thyroid Function Tests: Recent Labs    06/05/22 0449  TSH 1.178     Radiology Studies: Pelvis Portable  Result Date: 06/05/2022 CLINICAL DATA:  Left hip arthroplasty. EXAM: PORTABLE PELVIS 1-2 VIEWS COMPARISON:  None Available. FINDINGS: Left hip hemiarthroplasty appears in anatomic alignment. No evidence for loosening or fracture. Lateral left hip  soft tissue swelling, air and skin staples compatible with recent surgery. IMPRESSION: Left hip hemiarthroplasty appears in anatomic alignment. Electronically Signed   By: Darliss Cheney M.D.   On: 06/05/2022 19:17   DG HIP UNILAT WITH PELVIS 1V LEFT  Result Date: 06/05/2022 CLINICAL DATA:  Intraoperative left hip arthroplasty EXAM: DG HIP (WITH OR WITHOUT PELVIS) 1V*L* COMPARISON:  Left hip x-ray 06/04/2022 FINDINGS: Intraoperative left hip. Four low resolution intraoperative spot views of the left hip were obtained. Left hip total arthroplasty was placed. Alignment is anatomic. Total fluoroscopy time: 3 seconds Total radiation dose: 0.28 micro Gy IMPRESSION: Intraoperative left hip total arthroplasty. Electronically Signed   By: Darliss Cheney M.D.   On: 06/05/2022 17:40   DG C-Arm 1-60 Min-No Report  Result Date: 06/05/2022 Fluoroscopy was utilized by the requesting physician.  No radiographic interpretation.       LOS: 3 days   Arleatha Philipps [employer] on [email or web address]  06/07/2022, 10:49 AM

## 2022-06-08 DIAGNOSIS — S72002A Fracture of unspecified part of neck of left femur, initial encounter for closed fracture: Secondary | ICD-10-CM | POA: Diagnosis not present

## 2022-06-08 DIAGNOSIS — I10 Essential (primary) hypertension: Secondary | ICD-10-CM | POA: Diagnosis not present

## 2022-06-08 DIAGNOSIS — R9431 Abnormal electrocardiogram [ECG] [EKG]: Secondary | ICD-10-CM | POA: Diagnosis not present

## 2022-06-08 LAB — BASIC METABOLIC PANEL
Anion gap: 6 (ref 5–15)
BUN: 21 mg/dL (ref 8–23)
CO2: 28 mmol/L (ref 22–32)
Calcium: 8.2 mg/dL — ABNORMAL LOW (ref 8.9–10.3)
Chloride: 103 mmol/L (ref 98–111)
Creatinine, Ser: 0.75 mg/dL (ref 0.44–1.00)
GFR, Estimated: >60 mL/min (ref >60)
Glucose, Bld: 105 mg/dL — ABNORMAL HIGH (ref 70–99)
Potassium: 4.5 mmol/L (ref 3.5–5.1)
Sodium: 137 mmol/L (ref 135–145)

## 2022-06-08 LAB — CBC
HCT: 33.3 % — ABNORMAL LOW (ref 36.0–46.0)
Hemoglobin: 10.3 g/dL — ABNORMAL LOW (ref 12.0–15.0)
MCH: 28.5 pg (ref 26.0–34.0)
MCHC: 30.9 g/dL (ref 30.0–36.0)
MCV: 92 fL (ref 80.0–100.0)
Platelets: 273 10*3/uL (ref 150–400)
RBC: 3.62 MIL/uL — ABNORMAL LOW (ref 3.87–5.11)
RDW: 14.4 % (ref 11.5–15.5)
WBC: 11.5 10*3/uL — ABNORMAL HIGH (ref 4.0–10.5)
nRBC: 0 % (ref 0.0–0.2)

## 2022-06-08 MED ORDER — METOPROLOL TARTRATE 12.5 MG HALF TABLET
12.5000 mg | ORAL_TABLET | Freq: Two times a day (BID) | ORAL | Status: DC
  Administered 2022-06-08 – 2022-06-09 (×2): 12.5 mg via ORAL
  Filled 2022-06-08 (×2): qty 1

## 2022-06-08 NOTE — Plan of Care (Signed)
  Problem: Education: Goal: Knowledge of General Education information will improve Description: Including pain rating scale, medication(s)/side effects and non-pharmacologic comfort measures Outcome: Progressing   Problem: Activity: Goal: Risk for activity intolerance will decrease Outcome: Progressing   Problem: Pain Managment: Goal: General experience of comfort will improve Outcome: Progressing   Problem: Safety: Goal: Ability to remain free from injury will improve Outcome: Progressing   Problem: Activity: Goal: Ability to ambulate and perform ADLs will improve Outcome: Progressing   Problem: Clinical Measurements: Goal: Postoperative complications will be avoided or minimized Outcome: Progressing

## 2022-06-08 NOTE — Plan of Care (Signed)
  Problem: Pain Managment: Goal: General experience of comfort will improve Outcome: Progressing   Problem: Safety: Goal: Ability to remain free from injury will improve Outcome: Progressing   

## 2022-06-08 NOTE — Progress Notes (Signed)
Physical Therapy Treatment Patient Details Name: Dana Blake MRN: [id number] DOB: [birth year] Today's Date: 06/08/2022   History of Present Illness 88 y.o. female with medical history significant of hypertension anemia, depression who presented after a mechanical fall.  She lives in an independent living facility, [facility] screen.  She was found to have left hip fracture. Pt s/p L hip hemiarthroplasty 06/05/22    PT Comments    Pt is making excellent progress toward goals. Incr amb distance tolerance an decr overall pain with mobility. Plan is for SNF as pt family is unable to provide 24 hour supervision. Continue to follow. Plan and frequency updated accordingly.  Recommendations for follow up therapy are one component of a multi-disciplinary discharge planning process, led by the attending physician.  Recommendations may be updated based on patient status, additional functional criteria and insurance authorization.  Follow Up Recommendations  Skilled nursing-short term rehab (<3 hours/day) Can patient physically be transported by private vehicle: Yes   Assistance Recommended at Discharge Frequent or constant Supervision/Assistance  Patient can return home with the following A little help with bathing/dressing/bathroom;A little help with walking and/or transfers;Assist for transportation;Help with stairs or ramp for entrance   Equipment Recommendations  Rolling walker (2 wheels)    Recommendations for Other Services       Precautions / Restrictions Precautions Precautions: Fall (pt does NOT have anterior hip precautions) Restrictions Weight Bearing Restrictions: No LLE Weight Bearing: Weight bearing as tolerated     Mobility  Bed Mobility Overal bed mobility: Needs Assistance Bed Mobility: Sit to Supine       Sit to supine: Min assist   General bed mobility comments: assist to lift LEs into bed. pt able to scoot up in bed in supine    Transfers Overall transfer level:  Needs assistance Equipment used: Rolling walker (2 wheels) Transfers: Sit to/from Stand Sit to Stand: Supervision, Min guard           General transfer comment: for safety, cues for hand placement    Ambulation/Gait Ambulation/Gait assistance: Min guard Gait Distance (Feet):  (hallway ambulation) Assistive device: Rolling walker (2 wheels) Gait Pattern/deviations: Step-through pattern       General Gait Details: occasional directional cues d/t low vision and unfamiliar environment, improved fluidity of gait, incr stride length and improved wt shift to LLE   Stairs             Wheelchair Mobility    Modified Rankin (Stroke Patients Only)       Balance                                            Cognition Arousal/Alertness: Awake/alert Behavior During Therapy: WFL for tasks assessed/performed Overall Cognitive Status: Within Functional Limits for tasks assessed                                 General Comments: AxO x 3 pleasant Lady and following all instructions.  Pt is leagally blind and requires increased instruction for safety and navigating unfamiliar environment.        Exercises Total Joint Exercises Ankle Circles/Pumps: AROM, Both, 10 reps Quad Sets: AROM, Both, 5 reps    General Comments        Pertinent Vitals/Pain Pain Assessment Pain Assessment: Faces Faces Pain Scale: Hurts a little  bit Pain Location: L hip Pain Descriptors / Indicators: Sore Pain Intervention(s): Limited activity within patient's tolerance, Monitored during session    Home Living                          Prior Function            PT Goals (current goals can now be found in the care plan section) Acute Rehab PT Goals Patient Stated Goal: dance again PT Goal Formulation: With patient Time For Goal Achievement: 06/12/22 Potential to Achieve Goals: Good Progress towards PT goals: Progressing toward goals     Frequency    Min 3X/week      PT Plan Discharge plan needs to be updated;Frequency needs to be updated    Co-evaluation              AM-PAC PT "6 Clicks" Mobility   Outcome Measure  Help needed turning from your back to your side while in a flat bed without using bedrails?: A Little Help needed moving from lying on your back to sitting on the side of a flat bed without using bedrails?: A Little Help needed moving to and from a bed to a chair (including a wheelchair)?: A Little Help needed standing up from a chair using your arms (e.g., wheelchair or bedside chair)?: None Help needed to walk in hospital room?: A Little Help needed climbing 3-5 steps with a railing? : A Little 6 Click Score: 19    End of Session Equipment Utilized During Treatment: Gait belt Activity Tolerance: Patient tolerated treatment well Patient left: in bed;with call bell/phone within reach;with bed alarm set;with family/visitor present Nurse Communication: Mobility status PT Visit Diagnosis: Other abnormalities of gait and mobility (R26.89);Muscle weakness (generalized) (M62.81)     Time: 4540-9811 PT Time Calculation (min) (ACUTE ONLY): 15 min  Charges:  $Gait Training: 8-22 mins                     Baxter Flattery, PT  Acute Rehab Dept [facility]) [phone]  [facility] Weekend Pager [facility] only)  [phone]  06/08/2022    [facility] 06/08/2022, 11:47 AM

## 2022-06-08 NOTE — Plan of Care (Signed)
  Problem: Clinical Measurements: Goal: Respiratory complications will improve Outcome: Progressing   Problem: Clinical Measurements: Goal: Cardiovascular complication will be avoided Outcome: Progressing   Problem: Pain Managment: Goal: General experience of comfort will improve Outcome: Progressing   Problem: Activity: Goal: Ability to ambulate and perform ADLs will improve Outcome: Progressing

## 2022-06-08 NOTE — Progress Notes (Signed)
[facility] PROGRESS NOTE   Dana Blake:[id number] DOB: 02/18/1927 DOA: 06/04/2022  PCP: Daisy Floro, MD  Brief History/Interval Summary: 88 y.o. female with medical history significant of hypertension anemia, depression who presented after a mechanical fall.  She lives in an independent living facility, [facility] screen.  She was found to have left hip fracture.  She was hospitalized for further management.  Consultants: Orthopedics  Procedures: 9/28: Left hip hemiarthroplasty, anterior approach.     Subjective/Interval History: Patient mentions that she had some pain in her left hip area when she woke up this morning but now better after she was given pain medications.  Denies any chest pain shortness of breath.  Her son is at the bedside.  No nausea vomiting.      Assessment/Plan:  Left hip fracture Patient underwent left hip hemiarthroplasty on 9/28. Patient is stable from medical standpoint.  Is reasonably well controlled.  Continue with bowel regimen.  She mentioned that she had a bowel movement yesterday though none has been charted.   Seen by physical therapy.  Plan is for short-term rehab at skilled nursing facility.  Prior to admission she was in an independent living facility.  Sinus arrhythmia with tachyarrhythmia of unclear etiology EKG at the time of admission was read by the computer as atrial fibrillation.  However it was a very poor quality EKG.  EKG was repeated and shows sinus rhythm with sinus arrhythmia. On 9/29 after she worked with physical therapy she became tachycardic.  Rate was in the 140s and rhythm was somewhat irregular. She was given beta-blocker after which she slowed down and was noted to be again in sinus rhythm with sinus arrhythmia Rhythm strips were reviewed with cardiology.  EKGs were also reviewed.  Atrial fibrillation cannot be absolutely ruled out. All of these issues were discussed with patient and her daughter.  She  would be considered high risk if she is placed on anticoagulation due to advanced age, frequent falls at home and history of GI bleed in the past.  Patient and family at this time do not want anticoagulation.  In view of this we will not pursue this any further.  TSH was noted to be normal. No need to pursue echocardiogram.  No need to pursue 30-day monitor.  Heart rate has been well controlled.  No further recurrence of tachyarrhythmia.  Blood pressure noted to be on the lower side of normal.  We will cut back on the dose of metoprolol.  Normocytic anemia Hemoglobin stable for the most part.  Leukocytosis is likely reactive.  Seems to be improving.  Essential hypertension Beta-blocker was added for heart rate issues as discussed above.  Continue to hold losartan.  GERD Continue with PPI.  Also noted to be on Pepcid.  History of glaucoma Noted to be on Xalatan eyedrops which have been continued.  DVT Prophylaxis: Aspirin per orthopedics Code Status: Full code Family Communication: Discussed with her son Disposition Plan: SNF for short-term rehab  Status is: Inpatient Remains inpatient appropriate because: Left hip fracture requiring surgical intervention      Medications: Scheduled:  aspirin EC  325 mg Oral Daily   docusate sodium  100 mg Oral BID   famotidine  20 mg Oral QHS   feeding supplement  237 mL Oral BID BM   latanoprost  1 drop Both Eyes QHS   metoprolol tartrate  12.5 mg Oral BID   pantoprazole  40 mg Oral Daily   polyethylene glycol  17 g Oral Daily   senna  1 tablet Oral BID   Vitamin D (Ergocalciferol)  50,000 Units Oral Q7 days   Continuous:  methocarbamol (ROBAXIN) IV     EVO:[id number], alum & mag hydroxide-simeth, bisacodyl, HYDROcodone-acetaminophen, HYDROcodone-acetaminophen, HYDROmorphone (DILAUDID) injection, menthol-cetylpyridinium **OR** phenol, methocarbamol **OR** methocarbamol (ROBAXIN) IV, metoCLOPramide **OR** metoCLOPramide (REGLAN)  injection, morphine injection, ondansetron **OR** ondansetron (ZOFRAN) IV, polyethylene glycol  Antibiotics: Anti-infectives (From admission, onward)    Start     Dose/Rate Route Frequency Ordered Stop   06/05/22 2200  ceFAZolin (ANCEF) IVPB 2g/100 mL premix        2 g 200 mL/hr over 30 Minutes Intravenous Every 6 hours 06/05/22 1858 06/06/22 0412   06/05/22 1355  ceFAZolin (ANCEF) IVPB 2g/100 mL premix        2 g 200 mL/hr over 30 Minutes Intravenous On call to O.R. 06/05/22 1110 06/05/22 1550   06/05/22 1115  ceFAZolin (ANCEF) IVPB 3g/100 mL premix  Status:  Discontinued        3 g 200 mL/hr over 30 Minutes Intravenous On call to O.R. 06/05/22 1110 06/05/22 1356       Objective:  Vital Signs  Vitals:   06/07/22 0945 06/07/22 1051 06/07/22 2135 06/08/22 0522  BP: 120/73 (!) 127/57 (!) 94/55 (!) 102/57  Pulse: 67 65 79 79  Resp:  17 16 16   Temp:  97.8 F (36.6 C) 98 F (36.7 C) 98.1 F (36.7 C)  TempSrc:  Oral Oral Oral  SpO2:  96% 94% 90%  Weight:      Height:        Intake/Output Summary (Last 24 hours) at 06/08/2022 0955 Last data filed at 06/08/2022 0727 Gross per 24 hour  Intake 1250 ml  Output 0 ml  Net 1250 ml    Filed Weights   06/05/22 1124 06/05/22 1347  Weight: 52.2 kg 52.1 kg    General appearance: Awake alert.  In no distress Resp: Clear to auscultation bilaterally.  Normal effort Cardio: S1-S2 is normal regular.  No S3-S4.  No rubs murmurs or bruit GI: Abdomen is soft.  Nontender nondistended.  Bowel sounds are present normal.  No masses organomegaly    Lab Results:  Data Reviewed: I have personally reviewed following labs and reports of the imaging studies  CBC: Recent Labs  Lab 06/04/22 1530 06/06/22 0336 06/07/22 0429 06/08/22 0404  WBC 9.0 10.0 13.2* 11.5*  NEUTROABS 6.7  --   --   --   HGB 12.8 11.5* 10.4* 10.3*  HCT 39.6 37.0 32.3* 33.3*  MCV 88.6 91.4 89.5 92.0  PLT 313 199 218 273     Basic Metabolic Panel: Recent Labs   Lab 06/04/22 1530 06/06/22 0336 06/07/22 0429 06/08/22 0404  NA 139 136 135 137  K 4.4 3.6 4.1 4.5  CL 106 103 101 103  CO2 26 28 28 28   GLUCOSE 102* 105* 94 105*  BUN 16 17 19 21   CREATININE 0.57 0.71 0.73 0.75  CALCIUM 8.8* 8.2* 8.2* 8.2*     GFR: Estimated Creatinine Clearance: 36.1 mL/min (by C-G formula based on SCr of 0.75 mg/dL).   Cardiac Enzymes: Recent Labs  Lab 06/04/22 1530  CKTOTAL 74        Radiology Studies: No results found.     LOS: 4 days   Zayonna Ayuso [employer] on [email or web address]  06/08/2022, 9:55 AM

## 2022-06-08 NOTE — TOC Progression Note (Signed)
Transition of Care [facility]) - Progression Note    Patient Details  Name: CHITARA CLONCH MRN: [id number] Date of Birth: 1927/03/19  Transition of Care [facility]) CM/SW Contact  Ross Ludwig, [place] Phone Number: 06/08/2022, 5:41 PM  Clinical Narrative:     Contacted Admissions workers at [place], [facility], and [place] to have them review patient and see if they can offer a bed.  CSW to follow up on Monday regarding bed availability.  Patient will need insurance auth prior to discharge.  Expected Discharge Plan: [place] Barriers to Discharge: Continued Medical Work up  Expected Discharge Plan and Services Expected Discharge Plan: [place] In-house Referral: Clinical Social Work     Living arrangements for the past 2 months: [place]                                       Social Determinants of Health (SDOH) Interventions    Readmission Risk Interventions    06/06/2022   10:21 AM  Readmission Risk Prevention Plan  Transportation Screening Complete  PCP or Specialist Appt within 5-7 Days Complete  Home Care Screening Complete  Medication Review (RN CM) Complete

## 2022-06-09 DIAGNOSIS — K219 Gastro-esophageal reflux disease without esophagitis: Secondary | ICD-10-CM | POA: Diagnosis not present

## 2022-06-09 DIAGNOSIS — R2689 Other abnormalities of gait and mobility: Secondary | ICD-10-CM | POA: Diagnosis not present

## 2022-06-09 DIAGNOSIS — R278 Other lack of coordination: Secondary | ICD-10-CM | POA: Diagnosis not present

## 2022-06-09 DIAGNOSIS — M6281 Muscle weakness (generalized): Secondary | ICD-10-CM | POA: Diagnosis not present

## 2022-06-09 DIAGNOSIS — E440 Moderate protein-calorie malnutrition: Secondary | ICD-10-CM | POA: Diagnosis not present

## 2022-06-09 DIAGNOSIS — K279 Peptic ulcer, site unspecified, unspecified as acute or chronic, without hemorrhage or perforation: Secondary | ICD-10-CM | POA: Diagnosis not present

## 2022-06-09 DIAGNOSIS — Z9181 History of falling: Secondary | ICD-10-CM | POA: Diagnosis not present

## 2022-06-09 DIAGNOSIS — I10 Essential (primary) hypertension: Secondary | ICD-10-CM | POA: Diagnosis not present

## 2022-06-09 DIAGNOSIS — R531 Weakness: Secondary | ICD-10-CM | POA: Diagnosis not present

## 2022-06-09 DIAGNOSIS — J449 Chronic obstructive pulmonary disease, unspecified: Secondary | ICD-10-CM | POA: Diagnosis not present

## 2022-06-09 DIAGNOSIS — S72002A Fracture of unspecified part of neck of left femur, initial encounter for closed fracture: Secondary | ICD-10-CM | POA: Diagnosis not present

## 2022-06-09 DIAGNOSIS — S72002D Fracture of unspecified part of neck of left femur, subsequent encounter for closed fracture with routine healing: Secondary | ICD-10-CM | POA: Diagnosis not present

## 2022-06-09 MED ORDER — SENNOSIDES-DOCUSATE SODIUM 8.6-50 MG PO TABS: 2.0000 | ORAL_TABLET | Freq: Two times a day (BID) | ORAL | Status: AC

## 2022-06-09 MED ORDER — SENNOSIDES-DOCUSATE SODIUM 8.6-50 MG PO TABS: 2.0000 | ORAL_TABLET | Freq: Two times a day (BID) | ORAL | Status: DC

## 2022-06-09 MED ORDER — ENSURE ENLIVE PO LIQD: 237.0000 mL | Freq: Two times a day (BID) | ORAL | 12 refills | Status: AC

## 2022-06-09 MED ORDER — POLYETHYLENE GLYCOL 3350 17 G PO PACK: 17.0000 g | PACK | Freq: Every day | ORAL | 0 refills | Status: AC

## 2022-06-09 MED ORDER — METHOCARBAMOL 500 MG PO TABS: 500.0000 mg | ORAL_TABLET | Freq: Four times a day (QID) | ORAL | Status: AC | PRN

## 2022-06-09 MED ORDER — BISACODYL 10 MG RE SUPP
10.0000 mg | Freq: Once | RECTAL | Status: AC
  Administered 2022-06-09: 10 mg via RECTAL
  Filled 2022-06-09: qty 1

## 2022-06-09 MED ORDER — METOPROLOL TARTRATE 25 MG PO TABS: 12.5000 mg | ORAL_TABLET | Freq: Two times a day (BID) | ORAL | Status: AC

## 2022-06-09 NOTE — Progress Notes (Addendum)
15:22 Attempted to give report, left callback number to voicemail.  15:32 Report given to Chantel, all questions and concerns addressed.  Pt not in acute distress, discharged to facility with belongings accompanied by family.

## 2022-06-09 NOTE — TOC Transition Note (Signed)
Transition of Care [facility]) - CM/SW Discharge Note   Patient Details  Name: Dana Blake MRN: [id number] Date of Birth: [birth year]  Transition of Care [facility]) CM/SW Contact:  Lennart Pall, LCSW Phone Number: 06/09/2022, 2:31 PM   Clinical Narrative:    Pt and family have chosen SNF bed at [facility] and have received insurance authorization.  Medically cleared for dc today.  Pt and family have chosen to travel to facility via son's car.  RN to call report to [phone].  No further TOC needs.   Final next level of care: Skilled Nursing Facility Barriers to Discharge: Barriers Resolved   Patient Goals and CMS Choice Patient states their goals for this hospitalization and ongoing recovery are:: to return to [place] following rehab if needed      Discharge Placement PASRR number recieved: 06/07/22            Patient chooses bed at: [facility] Patient to be transferred to facility by: private vehicle Name of family member notified: son, Wyona Almas Patient and family notified of of transfer: 06/09/22  Discharge Plan and Services In-house Referral: Clinical Social Work              DME Arranged: N/A DME Agency: NA                  Social Determinants of Health ([place]) Interventions     Readmission Risk Interventions    06/06/2022   10:21 AM  Readmission Risk Prevention Plan  Transportation Screening Complete  PCP or Specialist Appt within 5-7 Days Complete  Home Care Screening Complete  Medication Review (RN CM) Complete

## 2022-06-09 NOTE — Discharge Summary (Signed)
[facility]  Physician Discharge Summary   Patient ID: Dana Blake MRN: [id number] DOB/AGE: 11/17/1926 88 y.o.  Admit date: 06/04/2022 Discharge date:   06/09/2022   PCP: Daisy Floro, MD  DISCHARGE DIAGNOSES:    Closed left hip fracture, initial encounter [facility])   Essential hypertension   GERD (gastroesophageal reflux disease)   Closed left hip fracture (HCC)   Malnutrition of moderate degree   RECOMMENDATIONS FOR OUTPATIENT FOLLOW UP: Follow-up with orthopedics as per their recommendations   Home Health: Going to SNF Equipment/Devices: Dana Blake  CODE STATUS: Full code  DISCHARGE CONDITION: fair  Diet recommendation: Regular as tolerated  INITIAL HISTORY: 88 y.o. female with medical history significant of hypertension anemia, depression who presented after a mechanical fall.  She lives in an independent living facility, [facility].  She was found to have left hip fracture.  She was hospitalized for further management.   Consultants: Orthopedics   Procedures: 9/28: Left hip hemiarthroplasty, anterior approach.    HOSPITAL COURSE:   Left hip fracture Patient underwent left hip hemiarthroplasty on 9/28. Patient is stable from a medical standpoint.  Pain is reasonably well controlled.  Has not had a bowel movement yet.  To get a suppository today.  She is already on a bowel regimen.  We will change her to Senokot and MiraLAX daily.  She mentions that she does sometimes have to do a suppository for her constipation. Patient was seen by physical therapy.  She was living in an independent living facility prior to admission.  Will need to go to skilled nursing facility for short-term rehab.     Sinus arrhythmia with tachyarrhythmia of unclear etiology EKG at the time of admission was read by the computer as atrial fibrillation.  However it was a very poor quality EKG.  EKG was repeated and shows sinus rhythm with sinus arrhythmia. On 9/29 after she worked  with physical therapy she became tachycardic.  Rate was in the 140s and rhythm was somewhat irregular. She was given beta-blocker after which she slowed down and was noted to be again in sinus rhythm with sinus arrhythmia Rhythm strips were reviewed with cardiology.  EKGs were also reviewed.  Atrial fibrillation cannot be absolutely ruled out. All of these issues were discussed with patient and her daughter.  She would be considered high risk if she is placed on anticoagulation due to advanced age, frequent falls at home and history of GI bleed in the past.  Patient and family at this time do not want anticoagulation.  In view of this we will not pursue this any further.  TSH was noted to be normal. No need to pursue echocardiogram.  No need to pursue 30-day monitor.  Heart rate has been well controlled.  No further recurrence of tachyarrhythmia.  Patient is tolerating low-dose metoprolol well.  Blood pressure is reasonably well controlled.    Normocytic anemia Hemoglobin stable for the most part.  Leukocytosis is likely reactive.  Seems to be improving.  She is afebrile.   Essential hypertension Beta-blocker was added for heart rate issues as discussed above.  Continue to hold losartan.   GERD Continue with PPI.    History of glaucoma Noted to be on Xalatan eyedrops which have been continued.  Moderate protein calorie malnutrition Nutrition Problem: Moderate Malnutrition Etiology: chronic illness Signs/Symptoms: moderate fat depletion, severe muscle depletion Interventions: Ensure Enlive (each supplement provides 350kcal and 20 grams of protein), Education  Patient is stable.  Okay for discharge to SNF  when bed is available.  PERTINENT LABS:  The results of significant diagnostics from this hospitalization (including imaging, microbiology, ancillary and laboratory) are listed below for reference.      Labs:   Basic Metabolic Panel: Recent Labs  Lab 06/04/22 1530  06/06/22 0336 06/07/22 0429 06/08/22 0404  NA 139 136 135 137  K 4.4 3.6 4.1 4.5  CL 106 103 101 103  CO2 26 28 28 28   GLUCOSE 102* 105* 94 105*  BUN 16 17 19 21   CREATININE 0.57 0.71 0.73 0.75  CALCIUM 8.8* 8.2* 8.2* 8.2*    CBC: Recent Labs  Lab 06/04/22 1530 06/06/22 0336 06/07/22 0429 06/08/22 0404  WBC 9.0 10.0 13.2* 11.5*  NEUTROABS 6.7  --   --   --   HGB 12.8 11.5* 10.4* 10.3*  HCT 39.6 37.0 32.3* 33.3*  MCV 88.6 91.4 89.5 92.0  PLT 313 199 218 273   Cardiac Enzymes: Recent Labs  Lab 06/04/22 1530  CKTOTAL 74     IMAGING STUDIES Pelvis Portable  Result Date: 06/05/2022 CLINICAL DATA:  Left hip arthroplasty. EXAM: PORTABLE PELVIS 1-2 VIEWS COMPARISON:  Dana Blake Available. FINDINGS: Left hip hemiarthroplasty appears in anatomic alignment. No evidence for loosening or fracture. Lateral left hip soft tissue swelling, air and skin staples compatible with recent surgery. IMPRESSION: Left hip hemiarthroplasty appears in anatomic alignment. Electronically Signed   By: 06/06/22 M.D.   On: 06/05/2022 19:17   DG HIP UNILAT WITH PELVIS 1V LEFT  Result Date: 06/05/2022 CLINICAL DATA:  Intraoperative left hip arthroplasty EXAM: DG HIP (WITH OR WITHOUT PELVIS) 1V*L* COMPARISON:  Left hip x-ray 06/04/2022 FINDINGS: Intraoperative left hip. Four low resolution intraoperative spot views of the left hip were obtained. Left hip total arthroplasty was placed. Alignment is anatomic. Total fluoroscopy time: 3 seconds Total radiation dose: 0.28 micro Gy IMPRESSION: Intraoperative left hip total arthroplasty. Electronically Signed   By: 06/07/2022 M.D.   On: 06/05/2022 17:40   DG C-Arm 1-60 Min-No Report  Result Date: 06/05/2022 Fluoroscopy was utilized by the requesting physician.  No radiographic interpretation.   DG Knee Left Port  Result Date: 06/05/2022 CLINICAL DATA:  88 year old female with history of closed displaced fracture of the left femoral neck. EXAM: PORTABLE LEFT  KNEE - 1-2 VIEW COMPARISON:  No priors. FINDINGS: Two views of the left knee demonstrate no acute displaced fracture, subluxation or dislocation. Mild joint space narrowing, subchondral sclerosis and osteophyte formation is noted, indicative of osteoarthritis. Chondrocalcinosis is also noted. IMPRESSION: 1. No acute radiographic abnormality of the left knee. 2. Chondrocalcinosis. Electronically Signed   By: 06/07/2022 M.D.   On: 06/05/2022 07:03   DG CHEST PORT 1 VIEW  Result Date: 06/04/2022 CLINICAL DATA:  Preop exam EXAM: PORTABLE CHEST 1 VIEW COMPARISON:  Chest 03/16/2022 FINDINGS: The heart size and mediastinal contours are within normal limits. Both lungs are clear. The visualized skeletal structures are unremarkable. IMPRESSION: No active disease. Electronically Signed   By: 06/06/2022 M.D.   On: 06/04/2022 20:00   CT Hip Left Wo Contrast  Result Date: 06/04/2022 CLINICAL DATA:  Left hip pain after fall. EXAM: CT OF THE LEFT HIP WITHOUT CONTRAST TECHNIQUE: Multidetector CT imaging of the left hip was performed according to the standard protocol. Multiplanar CT image reconstructions were also generated. RADIATION DOSE REDUCTION: This exam was performed according to the departmental dose-optimization program which includes automated exposure control, adjustment of the mA and/or kV according to patient size and/or use of  iterative reconstruction technique. COMPARISON:  Left hip x-rays from same day. FINDINGS: Bones/Joint/Cartilage Acute nondisplaced subcapital left femoral neck fracture. No additional fracture. No dislocation. Bilateral hip degenerative changes with chondrocalcinosis. Osteopenia. No joint effusion. Ligaments Ligaments are suboptimally evaluated by CT. Muscles and Tendons Grossly intact. Soft tissue No fluid collection or hematoma.  No soft tissue mass. IMPRESSION: 1. Acute nondisplaced subcapital left femoral neck fracture. Electronically Signed   By: Obie Dredge M.D.   On:  06/04/2022 17:30   DG Hip Unilat W or Wo Pelvis 2-3 Views Left  Result Date: 06/04/2022 CLINICAL DATA:  08/02/2010 EXAM: DG HIP (WITH OR WITHOUT PELVIS) 2-3V LEFT COMPARISON:  08/02/2010 FINDINGS: There is no evidence of hip fracture or dislocation. There is no evidence of arthropathy or other focal bone abnormality. IMPRESSION: Negative. Electronically Signed   By: Signa Kell M.D.   On: 06/04/2022 16:48    DISCHARGE EXAMINATION: Vitals:   06/08/22 1350 06/08/22 2108 06/09/22 0543 06/09/22 0938  BP: 127/71 (!) 136/53 139/77 (!) 143/71  Pulse: 88 71 79 65  Resp: 16 18 16 16   Temp: 98 F (36.7 C) 98.1 F (36.7 C) 98.3 F (36.8 C) 98.4 F (36.9 C)  TempSrc: Oral Oral Oral Oral  SpO2: (!) 88% 90% 95% 95%  Weight:      Height:       General appearance: Awake alert.  In no distress Resp: Clear to auscultation bilaterally.  Normal effort Cardio: S1-S2 is normal regular.  No S3-S4.  No rubs murmurs or bruit GI: Abdomen is soft.  Nontender nondistended.  Bowel sounds are present normal.  No masses organomegaly   DISPOSITION: SNF  Discharge Instructions     Call MD for:  difficulty breathing, headache or visual disturbances   Complete by: As directed    Call MD for:  extreme fatigue   Complete by: As directed    Call MD for:  hives   Complete by: As directed    Call MD for:  persistant dizziness or light-headedness   Complete by: As directed    Call MD for:  persistant nausea and vomiting   Complete by: As directed    Call MD for:  severe uncontrolled pain   Complete by: As directed    Call MD for:  temperature >100.4   Complete by: As directed    Diet general   Complete by: As directed    Discharge instructions   Complete by: As directed    Please review instructions on the discharge summary.  You were cared for by a hospitalist during your hospital stay. If you have any questions about your discharge medications or the care you received while you were in the hospital  after you are discharged, you can call the unit and asked to speak with the hospitalist on call if the hospitalist that took care of you is not available. Once you are discharged, your primary care physician will handle any further medical issues. Please note that NO REFILLS for any discharge medications will be authorized once you are discharged, as it is imperative that you return to your primary care physician (or establish a relationship with a primary care physician if you do not have one) for your aftercare needs so that they can reassess your need for medications and monitor your lab values. If you do not have a primary care physician, you can call [phone] for a physician referral.   Discharge wound care:   Complete by: As directed  As per orthopedics.   Increase activity slowly   Complete by: As directed           Allergies as of 06/09/2022       Reactions   Neo-synephrine [oxymetazoline] Other (See Comments)   "Hyper"   Prednisone Other (See Comments)   "Hyper"        Medication List     STOP taking these medications    famotidine 20 MG tablet Commonly known as: PEPCID   fluticasone 50 MCG/ACT nasal spray Commonly known as: FLONASE   Gas-X 80 MG chewable tablet Generic drug: simethicone   losartan 100 MG tablet Commonly known as: COZAAR   losartan-hydrochlorothiazide 50-12.5 MG tablet Commonly known as: Animator COVID-19 Vac Bivalent injection Generic drug: COVID-19 mRNA bivalent vaccine Therapist, music)   Pfizer-BioNT COVID-19 Vac-TriS Susp injection Generic drug: COVID-19 mRNA Vac-TriS (Pfizer)   sodium chloride 0.65 % Soln nasal spray Commonly known as: OCEAN       TAKE these medications    aspirin 81 MG chewable tablet Commonly known as: Aspirin Childrens Chew 1 tablet (81 mg total) by mouth 2 (two) times daily with a meal.   feeding supplement Liqd Take 237 mLs by mouth 2 (two) times daily between meals.   HYDROcodone-acetaminophen  10-325 MG tablet Commonly known as: Norco Take 0.5 tablets by mouth every 4 (four) hours as needed for up to 7 days for moderate pain or severe pain.   latanoprost 0.005 % ophthalmic solution Commonly known as: XALATAN Place 1 drop into both eyes at bedtime.   methocarbamol 500 MG tablet Commonly known as: ROBAXIN Take 1 tablet (500 mg total) by mouth every 6 (six) hours as needed for muscle spasms.   metoprolol tartrate 25 MG tablet Commonly known as: LOPRESSOR Take 0.5 tablets (12.5 mg total) by mouth 2 (two) times daily.   omeprazole 20 MG capsule Commonly known as: PRILOSEC Take 20 mg by mouth daily.   polyethylene glycol 17 g packet Commonly known as: MIRALAX / GLYCOLAX Take 17 g by mouth daily. Start taking on: June 10, 2022   senna-docusate 8.6-50 MG tablet Commonly known as: Senokot-S Take 2 tablets by mouth 2 (two) times daily.   Vitamin D3 50 MCG (2000 UT) Tabs Take 2,000 Units by mouth daily.               Discharge Care Instructions  (From admission, onward)           Start     Ordered   06/09/22 0000  Discharge wound care:       Comments: As per orthopedics.   06/09/22 7829              Follow-up Information     S[place]te[place], Aaron Edelman, MD Follow up in 2 week(s).   Specialty: Orthopedic Surgery Why: For suture removal, For wound re-check Contact information: [address]  [place] [address] [phone]         Lawerance Cruel, MD. Schedule an appointment as soon as possible for a visit in 1 week(s).   Specialty: Family Medicine Why: post hospitalization follow up Contact information: [place] [address] [phone]                 [place]: 65 minutes  [place]  [facility] Pager on [email or web address]  06/09/2022, 9:51 AM

## 2022-06-09 NOTE — Progress Notes (Signed)
    Subjective:  Patient reports pain as mild to moderate.  Denies N/V/CP/SOB/Abd pain. Denies tingling and numbness in LE bilaterally.   Objective:   VITALS:   Vitals:   06/08/22 0522 06/08/22 1350 06/08/22 2108 06/09/22 0543  BP: (!) 102/57 127/71 (!) 136/53 139/77  Pulse: 79 88 71 79  Resp: 16 16 18 16   Temp: 98.1 F (36.7 C) 98 F (36.7 C) 98.1 F (36.7 C) 98.3 F (36.8 C)  TempSrc: Oral Oral Oral Oral  SpO2: 90% (!) 88% 90% 95%  Weight:      Height:        Patient sitting up in bed. NAD.  Neurologically intact ABD soft Neurovascular intact Sensation intact distally Intact pulses distally Dorsiflexion/Plantar flexion intact Incision: dressing C/D/I No cellulitis present Compartment soft   Lab Results  Component Value Date   WBC 11.5 (H) 06/08/2022   HGB 10.3 (L) 06/08/2022   HCT 33.3 (L) 06/08/2022   MCV 92.0 06/08/2022   PLT 273 06/08/2022   BMET    Component Value Date/Time   NA 137 06/08/2022 0404   K 4.5 06/08/2022 0404   CL 103 06/08/2022 0404   CO2 28 06/08/2022 0404   GLUCOSE 105 (H) 06/08/2022 0404   BUN 21 06/08/2022 0404   CREATININE 0.75 06/08/2022 0404   CALCIUM 8.2 (L) 06/08/2022 0404   GFRNONAA >60 06/08/2022 0404     Assessment/Plan: 4 Days Post-Op   Principal Problem:   Closed left hip fracture, initial encounter ([place]) Active Problems:   Essential hypertension   GERD (gastroesophageal reflux disease)   Closed left hip fracture (HCC)   Malnutrition of moderate degree   WBAT with walker DVT ppx: Aspirin, SCDs, TEDS PO pain control PT/OT: PT has not seen yet. PT to come by today.  Dispo: Patient under care of the medical team, disposition per their recommendation. Patient lives at an independent living facility, but her daughter states someone might be able to stay with her vs SNF. Pain medication and DVT ppx printed in chart.    Charlott Rakes, PA-C 06/09/2022, 8:20 AM   [facility]  [place] [address],  [address], [place], [place] [address] Phone: [phone] [email or web address] Facebook  [employer]

## 2022-06-09 NOTE — Plan of Care (Signed)

## 2022-06-09 NOTE — Care Management Important Message (Signed)
Important Message  Patient Details IM Letter given to the Patient. Name: Dana Blake MRN: [id number] Date of Birth: 1926-12-19   Medicare Important Message Given:  Yes     Kerin Salen 06/09/2022, 1:52 PM

## 2022-06-09 NOTE — Progress Notes (Signed)
Physical Therapy Treatment Patient Details Name: Dana Blake MRN: [id number] DOB: 1927/04/12 Today's Date: 06/09/2022   History of Present Illness 88 y.o. female with medical history significant of hypertension anemia, depression who presented after a mechanical fall.  She lives in an independent living facility, [facility] screen.  She was found to have left hip fracture. Pt s/p L hip hemiarthroplasty 06/05/22    PT Comments    Pt attempting to get OOB on PT arrival with bathroom urgency. Assisted OOB and with in room amb, to bathroom. Pt continues to progress requiring decr environmental, directional cues d/t low vision.   Recommendations for follow up therapy are one component of a multi-disciplinary discharge planning process, led by the attending physician.  Recommendations may be updated based on patient status, additional functional criteria and insurance authorization.  Follow Up Recommendations  Skilled nursing-short term rehab (<3 hours/day) Can patient physically be transported by private vehicle: Yes   Assistance Recommended at Discharge Frequent or constant Supervision/Assistance  Patient can return home with the following A little help with bathing/dressing/bathroom;A little help with walking and/or transfers;Assist for transportation;Help with stairs or ramp for entrance   Equipment Recommendations  Rolling walker (2 wheels)    Recommendations for Other Services       Precautions / Restrictions Precautions Precautions: Fall (pt does NOT have anterior hip precautions) Restrictions Weight Bearing Restrictions: No LLE Weight Bearing: Weight bearing as tolerated     Mobility  Bed Mobility Overal bed mobility: Needs Assistance Bed Mobility: Supine to Sit     Supine to sit: Supervision     General bed mobility comments: for safety only, pt attepting to get up on PT arrival d/t bathroom urgency    Transfers Overall transfer level: Needs assistance Equipment  used: Rolling walker (2 wheels) Transfers: Sit to/from Stand Sit to Stand: Supervision           General transfer comment: for safety, cues for hand placement    Ambulation/Gait Ambulation/Gait assistance: Min guard, Supervision Gait Distance (Feet): 20 Feet Assistive device: Rolling walker (2 wheels) Gait Pattern/deviations: Step-through pattern       General Gait Details: cues for RW position from self, supervision for safety   Stairs             Wheelchair Mobility    Modified Rankin (Stroke Patients Only)       Balance                                            Cognition Arousal/Alertness: Awake/alert Behavior During Therapy: WFL for tasks assessed/performed Overall Cognitive Status: Within Functional Limits for tasks assessed                                 General Comments: AxO x 3 pleasant Lady and following all instructions.  Pt is leagally blind and requires increased instruction for safety and navigating unfamiliar environment.        Exercises Total Joint Exercises Ankle Circles/Pumps: AROM, Both, 10 reps    General Comments        Pertinent Vitals/Pain Pain Assessment Pain Assessment: 0-10 Pain Score: 2  Pain Location: L hip Pain Descriptors / Indicators: Sore Pain Intervention(s): Limited activity within patient's tolerance, Monitored during session, Repositioned, Premedicated before session    Home Living  Prior Function            PT Goals (current goals can now be found in the care plan section) Acute Rehab PT Goals Patient Stated Goal: dance again PT Goal Formulation: With patient Time For Goal Achievement: 06/12/22 Potential to Achieve Goals: Good Progress towards PT goals: Progressing toward goals    Frequency    Min 3X/week      PT Plan Discharge plan needs to be updated;Frequency needs to be updated    Co-evaluation               AM-PAC PT "6 Clicks" Mobility   Outcome Measure  Help needed turning from your back to your side while in a flat bed without using bedrails?: A Little Help needed moving from lying on your back to sitting on the side of a flat bed without using bedrails?: A Little Help needed moving to and from a bed to a chair (including a wheelchair)?: None Help needed standing up from a chair using your arms (e.g., wheelchair or bedside chair)?: None Help needed to walk in hospital room?: A Little Help needed climbing 3-5 steps with a railing? : A Little 6 Click Score: 20    End of Session Equipment Utilized During Treatment: Gait belt Activity Tolerance: Patient tolerated treatment well Patient left: with family/visitor present;Other (comment) (bathroom with call string in hand) Nurse Communication: Mobility status PT Visit Diagnosis: Other abnormalities of gait and mobility (R26.89);Muscle weakness (generalized) (M62.81)     Time: 8937-3428 PT Time Calculation (min) (ACUTE ONLY): 21 min  Charges:  $Gait Training: 8-22 mins                     Baxter Flattery, PT  Acute Rehab Dept [facility]) [phone]  [facility] Weekend Pager [facility] only)  [phone]  06/09/2022    [facility] 06/09/2022, 11:15 AM

## 2022-06-10 NOTE — Progress Notes (Signed)
Physical Therapy Treatment Patient Details Name: Dana Blake MRN: [id number] DOB: Jul 28, 1927 Today's Date: 06/10/2022-late ent   History of Present Illness 88 y.o. female with medical history significant of hypertension anemia, depression who presented after a mechanical fall.  She lives in an independent living facility, [facility] screen.  She was found to have left hip fracture. Pt s/p L hip hemiarthroplasty 06/05/22    PT Comments     See below for mobility details. Pt continues to be motivated and progressing well with PT. Anticipate excellent progress at SNF   Recommendations for follow up therapy are one component of a multi-disciplinary discharge planning process, led by the attending physician.  Recommendations may be updated based on patient status, additional functional criteria and insurance authorization.  Follow Up Recommendations  Skilled nursing-short term rehab (<3 hours/day) Can patient physically be transported by private vehicle: Yes   Assistance Recommended at Discharge Frequent or constant Supervision/Assistance  Patient can return home with the following A little help with bathing/dressing/bathroom;A little help with walking and/or transfers;Assist for transportation;Help with stairs or ramp for entrance   Equipment Recommendations  Rolling walker (2 wheels)    Recommendations for Other Services       Precautions / Restrictions Precautions Precautions: Fall Restrictions LLE Weight Bearing: Weight bearing as tolerated     Mobility  Bed Mobility               General bed mobility comments: on EOB    Transfers Overall transfer level: Needs assistance Equipment used: Rolling walker (2 wheels) Transfers: Sit to/from Stand Sit to Stand: Supervision           General transfer comment: for safety, cues for hand placement    Ambulation/Gait Ambulation/Gait assistance: Min guard, Supervision Gait Distance (Feet): 15 Feet Assistive device:  Rolling walker (2 wheels) Gait Pattern/deviations: Step-through pattern       General Gait Details: cues for RW position from self, supervision for safety   Stairs             Wheelchair Mobility    Modified Rankin (Stroke Patients Only)       Balance Overall balance assessment: Needs assistance   Sitting balance-Leahy Scale: Good     Standing balance support: No upper extremity supported, During functional activity, Reliant on assistive device for balance, Bilateral upper extremity supported Standing balance-Leahy Scale: Fair Standing balance comment: Fair +; pt able to assist with managing LB garments in standing without LOB. lightly reliant on RW/UE support for dynamic ambulation/tasks                            Cognition Arousal/Alertness: Awake/alert Behavior During Therapy: WFL for tasks assessed/performed Overall Cognitive Status: Within Functional Limits for tasks assessed                                          Exercises      General Comments General comments (skin integrity, edema, etc.): assisted pt with dressing in preparation for d/c. educated on initiating in sitting for safety and donning LB garments over operative LE first. pt demonstrates improved sitting balance with tasks, able to reahc outside BOS and sift wt anteriorly without LOB. supervision for safety and d/t low vision.      Pertinent Vitals/Pain Pain Assessment Pain Assessment: Faces Faces Pain Scale: Hurts a little bit  Pain Location: L hip Pain Descriptors / Indicators: Sore Pain Intervention(s): Limited activity within patient's tolerance, Monitored during session    Home Living                          Prior Function            PT Goals (current goals can now be found in the care plan section) Acute Rehab PT Goals Patient Stated Goal: dance again PT Goal Formulation: With patient Time For Goal Achievement: 06/12/22 Potential to  Achieve Goals: Good Progress towards PT goals: Progressing toward goals    Frequency    Min 3X/week      PT Plan Current plan remains appropriate    Co-evaluation              AM-PAC PT "6 Clicks" Mobility   Outcome Measure  Help needed turning from your back to your side while in a flat bed without using bedrails?: A Little Help needed moving from lying on your back to sitting on the side of a flat bed without using bedrails?: A Little Help needed moving to and from a bed to a chair (including a wheelchair)?: None Help needed standing up from a chair using your arms (e.g., wheelchair or bedside chair)?: None Help needed to walk in hospital room?: A Little Help needed climbing 3-5 steps with a railing? : A Little 6 Click Score: 20    End of Session Equipment Utilized During Treatment: Gait belt Activity Tolerance: Patient tolerated treatment well Patient left: with call bell/phone within reach;with family/visitor present;Other (comment) (EOB) Nurse Communication: Mobility status PT Visit Diagnosis: Other abnormalities of gait and mobility (R26.89);Muscle weakness (generalized) (M62.81)     Time: 5621-3086 PT Time Calculation (min) (ACUTE ONLY): 22 min  Charges:                     Delice Bison, PT  Acute Rehab Dept [facility]) [phone]  [facility] Weekend Pager [facility] only)  [phone]  06/10/2022    [facility] 06/10/2022, 5:04 PM

## 2022-06-12 DIAGNOSIS — I10 Essential (primary) hypertension: Secondary | ICD-10-CM | POA: Diagnosis not present

## 2022-06-12 DIAGNOSIS — S72002D Fracture of unspecified part of neck of left femur, subsequent encounter for closed fracture with routine healing: Secondary | ICD-10-CM | POA: Diagnosis not present

## 2022-06-12 DIAGNOSIS — R531 Weakness: Secondary | ICD-10-CM | POA: Diagnosis not present

## 2022-06-13 DIAGNOSIS — J449 Chronic obstructive pulmonary disease, unspecified: Secondary | ICD-10-CM | POA: Diagnosis not present

## 2022-06-13 DIAGNOSIS — K279 Peptic ulcer, site unspecified, unspecified as acute or chronic, without hemorrhage or perforation: Secondary | ICD-10-CM | POA: Diagnosis not present

## 2022-06-13 DIAGNOSIS — E440 Moderate protein-calorie malnutrition: Secondary | ICD-10-CM | POA: Diagnosis not present

## 2022-06-13 DIAGNOSIS — I10 Essential (primary) hypertension: Secondary | ICD-10-CM | POA: Diagnosis not present

## 2022-06-16 DIAGNOSIS — J449 Chronic obstructive pulmonary disease, unspecified: Secondary | ICD-10-CM | POA: Diagnosis not present

## 2022-06-16 DIAGNOSIS — S72002D Fracture of unspecified part of neck of left femur, subsequent encounter for closed fracture with routine healing: Secondary | ICD-10-CM | POA: Diagnosis not present

## 2022-06-16 DIAGNOSIS — M6281 Muscle weakness (generalized): Secondary | ICD-10-CM | POA: Diagnosis not present

## 2022-06-16 DIAGNOSIS — Z9181 History of falling: Secondary | ICD-10-CM | POA: Diagnosis not present

## 2022-06-16 DIAGNOSIS — S72002A Fracture of unspecified part of neck of left femur, initial encounter for closed fracture: Secondary | ICD-10-CM | POA: Diagnosis not present

## 2022-06-16 DIAGNOSIS — R2689 Other abnormalities of gait and mobility: Secondary | ICD-10-CM | POA: Diagnosis not present

## 2022-06-18 DIAGNOSIS — M6281 Muscle weakness (generalized): Secondary | ICD-10-CM | POA: Diagnosis not present

## 2022-06-18 DIAGNOSIS — R2689 Other abnormalities of gait and mobility: Secondary | ICD-10-CM | POA: Diagnosis not present

## 2022-06-20 DIAGNOSIS — Z23 Encounter for immunization: Secondary | ICD-10-CM | POA: Diagnosis not present

## 2022-06-20 DIAGNOSIS — R2689 Other abnormalities of gait and mobility: Secondary | ICD-10-CM | POA: Diagnosis not present

## 2022-06-20 DIAGNOSIS — S72002D Fracture of unspecified part of neck of left femur, subsequent encounter for closed fracture with routine healing: Secondary | ICD-10-CM | POA: Diagnosis not present

## 2022-06-20 DIAGNOSIS — I959 Hypotension, unspecified: Secondary | ICD-10-CM | POA: Diagnosis not present

## 2022-06-20 DIAGNOSIS — M6281 Muscle weakness (generalized): Secondary | ICD-10-CM | POA: Diagnosis not present

## 2022-06-20 DIAGNOSIS — Z09 Encounter for follow-up examination after completed treatment for conditions other than malignant neoplasm: Secondary | ICD-10-CM | POA: Diagnosis not present

## 2022-06-20 DIAGNOSIS — E538 Deficiency of other specified B group vitamins: Secondary | ICD-10-CM | POA: Diagnosis not present

## 2022-06-23 DIAGNOSIS — S72032D Displaced midcervical fracture of left femur, subsequent encounter for closed fracture with routine healing: Secondary | ICD-10-CM | POA: Diagnosis not present

## 2022-06-23 DIAGNOSIS — M6281 Muscle weakness (generalized): Secondary | ICD-10-CM | POA: Diagnosis not present

## 2022-06-23 DIAGNOSIS — Z9181 History of falling: Secondary | ICD-10-CM | POA: Diagnosis not present

## 2022-06-23 DIAGNOSIS — S72002D Fracture of unspecified part of neck of left femur, subsequent encounter for closed fracture with routine healing: Secondary | ICD-10-CM | POA: Diagnosis not present

## 2022-06-24 DIAGNOSIS — R2689 Other abnormalities of gait and mobility: Secondary | ICD-10-CM | POA: Diagnosis not present

## 2022-06-24 DIAGNOSIS — M6281 Muscle weakness (generalized): Secondary | ICD-10-CM | POA: Diagnosis not present

## 2022-06-25 DIAGNOSIS — R488 Other symbolic dysfunctions: Secondary | ICD-10-CM | POA: Diagnosis not present

## 2022-06-25 DIAGNOSIS — M6281 Muscle weakness (generalized): Secondary | ICD-10-CM | POA: Diagnosis not present

## 2022-06-25 DIAGNOSIS — R2689 Other abnormalities of gait and mobility: Secondary | ICD-10-CM | POA: Diagnosis not present

## 2022-06-26 DIAGNOSIS — S72002D Fracture of unspecified part of neck of left femur, subsequent encounter for closed fracture with routine healing: Secondary | ICD-10-CM | POA: Diagnosis not present

## 2022-06-26 DIAGNOSIS — R2689 Other abnormalities of gait and mobility: Secondary | ICD-10-CM | POA: Diagnosis not present

## 2022-06-26 DIAGNOSIS — R488 Other symbolic dysfunctions: Secondary | ICD-10-CM | POA: Diagnosis not present

## 2022-06-26 DIAGNOSIS — Z9181 History of falling: Secondary | ICD-10-CM | POA: Diagnosis not present

## 2022-06-26 DIAGNOSIS — M6281 Muscle weakness (generalized): Secondary | ICD-10-CM | POA: Diagnosis not present

## 2022-06-30 DIAGNOSIS — S72002D Fracture of unspecified part of neck of left femur, subsequent encounter for closed fracture with routine healing: Secondary | ICD-10-CM | POA: Diagnosis not present

## 2022-06-30 DIAGNOSIS — M6281 Muscle weakness (generalized): Secondary | ICD-10-CM | POA: Diagnosis not present

## 2022-06-30 DIAGNOSIS — Z9181 History of falling: Secondary | ICD-10-CM | POA: Diagnosis not present

## 2022-06-30 DIAGNOSIS — R2689 Other abnormalities of gait and mobility: Secondary | ICD-10-CM | POA: Diagnosis not present

## 2022-07-01 DIAGNOSIS — M6281 Muscle weakness (generalized): Secondary | ICD-10-CM | POA: Diagnosis not present

## 2022-07-01 DIAGNOSIS — R2689 Other abnormalities of gait and mobility: Secondary | ICD-10-CM | POA: Diagnosis not present

## 2022-07-02 DIAGNOSIS — R488 Other symbolic dysfunctions: Secondary | ICD-10-CM | POA: Diagnosis not present

## 2022-07-02 DIAGNOSIS — Z9181 History of falling: Secondary | ICD-10-CM | POA: Diagnosis not present

## 2022-07-02 DIAGNOSIS — M6281 Muscle weakness (generalized): Secondary | ICD-10-CM | POA: Diagnosis not present

## 2022-07-02 DIAGNOSIS — R2689 Other abnormalities of gait and mobility: Secondary | ICD-10-CM | POA: Diagnosis not present

## 2022-07-02 DIAGNOSIS — S72002D Fracture of unspecified part of neck of left femur, subsequent encounter for closed fracture with routine healing: Secondary | ICD-10-CM | POA: Diagnosis not present

## 2022-07-03 DIAGNOSIS — M6281 Muscle weakness (generalized): Secondary | ICD-10-CM | POA: Diagnosis not present

## 2022-07-03 DIAGNOSIS — R2689 Other abnormalities of gait and mobility: Secondary | ICD-10-CM | POA: Diagnosis not present

## 2022-07-04 DIAGNOSIS — S72002D Fracture of unspecified part of neck of left femur, subsequent encounter for closed fracture with routine healing: Secondary | ICD-10-CM | POA: Diagnosis not present

## 2022-07-04 DIAGNOSIS — R2689 Other abnormalities of gait and mobility: Secondary | ICD-10-CM | POA: Diagnosis not present

## 2022-07-04 DIAGNOSIS — R488 Other symbolic dysfunctions: Secondary | ICD-10-CM | POA: Diagnosis not present

## 2022-07-04 DIAGNOSIS — Z9181 History of falling: Secondary | ICD-10-CM | POA: Diagnosis not present

## 2022-07-04 DIAGNOSIS — M6281 Muscle weakness (generalized): Secondary | ICD-10-CM | POA: Diagnosis not present

## 2022-07-07 DIAGNOSIS — S72002D Fracture of unspecified part of neck of left femur, subsequent encounter for closed fracture with routine healing: Secondary | ICD-10-CM | POA: Diagnosis not present

## 2022-07-07 DIAGNOSIS — R488 Other symbolic dysfunctions: Secondary | ICD-10-CM | POA: Diagnosis not present

## 2022-07-07 DIAGNOSIS — Z9181 History of falling: Secondary | ICD-10-CM | POA: Diagnosis not present

## 2022-07-07 DIAGNOSIS — M6281 Muscle weakness (generalized): Secondary | ICD-10-CM | POA: Diagnosis not present

## 2022-07-07 DIAGNOSIS — R2689 Other abnormalities of gait and mobility: Secondary | ICD-10-CM | POA: Diagnosis not present

## 2022-07-08 DIAGNOSIS — R2689 Other abnormalities of gait and mobility: Secondary | ICD-10-CM | POA: Diagnosis not present

## 2022-07-08 DIAGNOSIS — M6281 Muscle weakness (generalized): Secondary | ICD-10-CM | POA: Diagnosis not present

## 2022-07-08 DIAGNOSIS — R488 Other symbolic dysfunctions: Secondary | ICD-10-CM | POA: Diagnosis not present

## 2022-07-09 DIAGNOSIS — R2689 Other abnormalities of gait and mobility: Secondary | ICD-10-CM | POA: Diagnosis not present

## 2022-07-09 DIAGNOSIS — Z9181 History of falling: Secondary | ICD-10-CM | POA: Diagnosis not present

## 2022-07-09 DIAGNOSIS — S72002D Fracture of unspecified part of neck of left femur, subsequent encounter for closed fracture with routine healing: Secondary | ICD-10-CM | POA: Diagnosis not present

## 2022-07-09 DIAGNOSIS — M6281 Muscle weakness (generalized): Secondary | ICD-10-CM | POA: Diagnosis not present

## 2022-07-10 DIAGNOSIS — R2689 Other abnormalities of gait and mobility: Secondary | ICD-10-CM | POA: Diagnosis not present

## 2022-07-10 DIAGNOSIS — R488 Other symbolic dysfunctions: Secondary | ICD-10-CM | POA: Diagnosis not present

## 2022-07-10 DIAGNOSIS — M6281 Muscle weakness (generalized): Secondary | ICD-10-CM | POA: Diagnosis not present

## 2022-07-11 DIAGNOSIS — M6281 Muscle weakness (generalized): Secondary | ICD-10-CM | POA: Diagnosis not present

## 2022-07-11 DIAGNOSIS — Z9181 History of falling: Secondary | ICD-10-CM | POA: Diagnosis not present

## 2022-07-11 DIAGNOSIS — S72002D Fracture of unspecified part of neck of left femur, subsequent encounter for closed fracture with routine healing: Secondary | ICD-10-CM | POA: Diagnosis not present

## 2022-07-14 DIAGNOSIS — M6281 Muscle weakness (generalized): Secondary | ICD-10-CM | POA: Diagnosis not present

## 2022-07-14 DIAGNOSIS — S72002D Fracture of unspecified part of neck of left femur, subsequent encounter for closed fracture with routine healing: Secondary | ICD-10-CM | POA: Diagnosis not present

## 2022-07-14 DIAGNOSIS — Z9181 History of falling: Secondary | ICD-10-CM | POA: Diagnosis not present

## 2022-07-15 DIAGNOSIS — M6281 Muscle weakness (generalized): Secondary | ICD-10-CM | POA: Diagnosis not present

## 2022-07-15 DIAGNOSIS — R2689 Other abnormalities of gait and mobility: Secondary | ICD-10-CM | POA: Diagnosis not present

## 2022-07-16 DIAGNOSIS — R2689 Other abnormalities of gait and mobility: Secondary | ICD-10-CM | POA: Diagnosis not present

## 2022-07-16 DIAGNOSIS — M6281 Muscle weakness (generalized): Secondary | ICD-10-CM | POA: Diagnosis not present

## 2022-07-17 DIAGNOSIS — M6281 Muscle weakness (generalized): Secondary | ICD-10-CM | POA: Diagnosis not present

## 2022-07-17 DIAGNOSIS — R488 Other symbolic dysfunctions: Secondary | ICD-10-CM | POA: Diagnosis not present

## 2022-07-17 DIAGNOSIS — R2689 Other abnormalities of gait and mobility: Secondary | ICD-10-CM | POA: Diagnosis not present

## 2022-07-18 DIAGNOSIS — E538 Deficiency of other specified B group vitamins: Secondary | ICD-10-CM | POA: Diagnosis not present

## 2022-07-21 DIAGNOSIS — Z9181 History of falling: Secondary | ICD-10-CM | POA: Diagnosis not present

## 2022-07-21 DIAGNOSIS — R2689 Other abnormalities of gait and mobility: Secondary | ICD-10-CM | POA: Diagnosis not present

## 2022-07-21 DIAGNOSIS — S72032D Displaced midcervical fracture of left femur, subsequent encounter for closed fracture with routine healing: Secondary | ICD-10-CM | POA: Diagnosis not present

## 2022-07-21 DIAGNOSIS — M6281 Muscle weakness (generalized): Secondary | ICD-10-CM | POA: Diagnosis not present

## 2022-07-21 DIAGNOSIS — Z471 Aftercare following joint replacement surgery: Secondary | ICD-10-CM | POA: Diagnosis not present

## 2022-07-21 DIAGNOSIS — S72002D Fracture of unspecified part of neck of left femur, subsequent encounter for closed fracture with routine healing: Secondary | ICD-10-CM | POA: Diagnosis not present

## 2022-07-23 DIAGNOSIS — Z9181 History of falling: Secondary | ICD-10-CM | POA: Diagnosis not present

## 2022-07-23 DIAGNOSIS — S72002D Fracture of unspecified part of neck of left femur, subsequent encounter for closed fracture with routine healing: Secondary | ICD-10-CM | POA: Diagnosis not present

## 2022-07-23 DIAGNOSIS — R488 Other symbolic dysfunctions: Secondary | ICD-10-CM | POA: Diagnosis not present

## 2022-07-23 DIAGNOSIS — R2689 Other abnormalities of gait and mobility: Secondary | ICD-10-CM | POA: Diagnosis not present

## 2022-07-23 DIAGNOSIS — M6281 Muscle weakness (generalized): Secondary | ICD-10-CM | POA: Diagnosis not present

## 2022-07-24 DIAGNOSIS — R2689 Other abnormalities of gait and mobility: Secondary | ICD-10-CM | POA: Diagnosis not present

## 2022-07-24 DIAGNOSIS — M6281 Muscle weakness (generalized): Secondary | ICD-10-CM | POA: Diagnosis not present

## 2022-07-28 DIAGNOSIS — R2689 Other abnormalities of gait and mobility: Secondary | ICD-10-CM | POA: Diagnosis not present

## 2022-07-28 DIAGNOSIS — Z9181 History of falling: Secondary | ICD-10-CM | POA: Diagnosis not present

## 2022-07-28 DIAGNOSIS — S72002D Fracture of unspecified part of neck of left femur, subsequent encounter for closed fracture with routine healing: Secondary | ICD-10-CM | POA: Diagnosis not present

## 2022-07-28 DIAGNOSIS — M6281 Muscle weakness (generalized): Secondary | ICD-10-CM | POA: Diagnosis not present

## 2022-07-29 DIAGNOSIS — S72002D Fracture of unspecified part of neck of left femur, subsequent encounter for closed fracture with routine healing: Secondary | ICD-10-CM | POA: Diagnosis not present

## 2022-07-29 DIAGNOSIS — M6281 Muscle weakness (generalized): Secondary | ICD-10-CM | POA: Diagnosis not present

## 2022-07-29 DIAGNOSIS — Z9181 History of falling: Secondary | ICD-10-CM | POA: Diagnosis not present

## 2022-07-30 DIAGNOSIS — R2689 Other abnormalities of gait and mobility: Secondary | ICD-10-CM | POA: Diagnosis not present

## 2022-07-30 DIAGNOSIS — R488 Other symbolic dysfunctions: Secondary | ICD-10-CM | POA: Diagnosis not present

## 2022-07-30 DIAGNOSIS — M6281 Muscle weakness (generalized): Secondary | ICD-10-CM | POA: Diagnosis not present

## 2022-08-04 DIAGNOSIS — M6281 Muscle weakness (generalized): Secondary | ICD-10-CM | POA: Diagnosis not present

## 2022-08-04 DIAGNOSIS — R2689 Other abnormalities of gait and mobility: Secondary | ICD-10-CM | POA: Diagnosis not present

## 2022-08-05 DIAGNOSIS — M6281 Muscle weakness (generalized): Secondary | ICD-10-CM | POA: Diagnosis not present

## 2022-08-05 DIAGNOSIS — Z9181 History of falling: Secondary | ICD-10-CM | POA: Diagnosis not present

## 2022-08-05 DIAGNOSIS — S72002D Fracture of unspecified part of neck of left femur, subsequent encounter for closed fracture with routine healing: Secondary | ICD-10-CM | POA: Diagnosis not present

## 2022-08-06 DIAGNOSIS — S72002D Fracture of unspecified part of neck of left femur, subsequent encounter for closed fracture with routine healing: Secondary | ICD-10-CM | POA: Diagnosis not present

## 2022-08-06 DIAGNOSIS — R488 Other symbolic dysfunctions: Secondary | ICD-10-CM | POA: Diagnosis not present

## 2022-08-06 DIAGNOSIS — M6281 Muscle weakness (generalized): Secondary | ICD-10-CM | POA: Diagnosis not present

## 2022-08-06 DIAGNOSIS — Z9181 History of falling: Secondary | ICD-10-CM | POA: Diagnosis not present

## 2022-08-11 DIAGNOSIS — M6281 Muscle weakness (generalized): Secondary | ICD-10-CM | POA: Diagnosis not present

## 2022-08-11 DIAGNOSIS — R2689 Other abnormalities of gait and mobility: Secondary | ICD-10-CM | POA: Diagnosis not present

## 2022-08-12 DIAGNOSIS — E538 Deficiency of other specified B group vitamins: Secondary | ICD-10-CM | POA: Diagnosis not present

## 2022-08-14 DIAGNOSIS — R488 Other symbolic dysfunctions: Secondary | ICD-10-CM | POA: Diagnosis not present

## 2022-08-15 DIAGNOSIS — S72002D Fracture of unspecified part of neck of left femur, subsequent encounter for closed fracture with routine healing: Secondary | ICD-10-CM | POA: Diagnosis not present

## 2022-08-15 DIAGNOSIS — M6281 Muscle weakness (generalized): Secondary | ICD-10-CM | POA: Diagnosis not present

## 2022-08-15 DIAGNOSIS — Z9181 History of falling: Secondary | ICD-10-CM | POA: Diagnosis not present

## 2022-08-18 DIAGNOSIS — S72002D Fracture of unspecified part of neck of left femur, subsequent encounter for closed fracture with routine healing: Secondary | ICD-10-CM | POA: Diagnosis not present

## 2022-08-18 DIAGNOSIS — M6281 Muscle weakness (generalized): Secondary | ICD-10-CM | POA: Diagnosis not present

## 2022-08-18 DIAGNOSIS — R2689 Other abnormalities of gait and mobility: Secondary | ICD-10-CM | POA: Diagnosis not present

## 2022-08-18 DIAGNOSIS — Z9181 History of falling: Secondary | ICD-10-CM | POA: Diagnosis not present

## 2022-08-19 DIAGNOSIS — M6281 Muscle weakness (generalized): Secondary | ICD-10-CM | POA: Diagnosis not present

## 2022-08-19 DIAGNOSIS — R2689 Other abnormalities of gait and mobility: Secondary | ICD-10-CM | POA: Diagnosis not present

## 2022-08-20 DIAGNOSIS — R2689 Other abnormalities of gait and mobility: Secondary | ICD-10-CM | POA: Diagnosis not present

## 2022-08-20 DIAGNOSIS — S72002D Fracture of unspecified part of neck of left femur, subsequent encounter for closed fracture with routine healing: Secondary | ICD-10-CM | POA: Diagnosis not present

## 2022-08-20 DIAGNOSIS — M6281 Muscle weakness (generalized): Secondary | ICD-10-CM | POA: Diagnosis not present

## 2022-08-20 DIAGNOSIS — Z9181 History of falling: Secondary | ICD-10-CM | POA: Diagnosis not present

## 2022-08-21 DIAGNOSIS — R2689 Other abnormalities of gait and mobility: Secondary | ICD-10-CM | POA: Diagnosis not present

## 2022-08-21 DIAGNOSIS — R488 Other symbolic dysfunctions: Secondary | ICD-10-CM | POA: Diagnosis not present

## 2022-08-21 DIAGNOSIS — M6281 Muscle weakness (generalized): Secondary | ICD-10-CM | POA: Diagnosis not present

## 2022-08-25 DIAGNOSIS — M6281 Muscle weakness (generalized): Secondary | ICD-10-CM | POA: Diagnosis not present

## 2022-08-25 DIAGNOSIS — R2689 Other abnormalities of gait and mobility: Secondary | ICD-10-CM | POA: Diagnosis not present

## 2022-08-25 DIAGNOSIS — S72002D Fracture of unspecified part of neck of left femur, subsequent encounter for closed fracture with routine healing: Secondary | ICD-10-CM | POA: Diagnosis not present

## 2022-08-25 DIAGNOSIS — R488 Other symbolic dysfunctions: Secondary | ICD-10-CM | POA: Diagnosis not present

## 2022-08-25 DIAGNOSIS — Z9181 History of falling: Secondary | ICD-10-CM | POA: Diagnosis not present

## 2022-08-27 DIAGNOSIS — Z9181 History of falling: Secondary | ICD-10-CM | POA: Diagnosis not present

## 2022-08-27 DIAGNOSIS — M6281 Muscle weakness (generalized): Secondary | ICD-10-CM | POA: Diagnosis not present

## 2022-08-27 DIAGNOSIS — S72002D Fracture of unspecified part of neck of left femur, subsequent encounter for closed fracture with routine healing: Secondary | ICD-10-CM | POA: Diagnosis not present

## 2022-09-03 DIAGNOSIS — R2689 Other abnormalities of gait and mobility: Secondary | ICD-10-CM | POA: Diagnosis not present

## 2022-09-03 DIAGNOSIS — M6281 Muscle weakness (generalized): Secondary | ICD-10-CM | POA: Diagnosis not present

## 2022-09-04 DIAGNOSIS — Z9181 History of falling: Secondary | ICD-10-CM | POA: Diagnosis not present

## 2022-09-04 DIAGNOSIS — M6281 Muscle weakness (generalized): Secondary | ICD-10-CM | POA: Diagnosis not present

## 2022-09-04 DIAGNOSIS — S72002D Fracture of unspecified part of neck of left femur, subsequent encounter for closed fracture with routine healing: Secondary | ICD-10-CM | POA: Diagnosis not present

## 2022-09-04 DIAGNOSIS — R2689 Other abnormalities of gait and mobility: Secondary | ICD-10-CM | POA: Diagnosis not present

## 2022-09-05 DIAGNOSIS — Z9181 History of falling: Secondary | ICD-10-CM | POA: Diagnosis not present

## 2022-09-05 DIAGNOSIS — M6281 Muscle weakness (generalized): Secondary | ICD-10-CM | POA: Diagnosis not present

## 2022-09-05 DIAGNOSIS — S72002D Fracture of unspecified part of neck of left femur, subsequent encounter for closed fracture with routine healing: Secondary | ICD-10-CM | POA: Diagnosis not present

## 2022-09-08 DIAGNOSIS — R488 Other symbolic dysfunctions: Secondary | ICD-10-CM | POA: Diagnosis not present

## 2022-09-09 DIAGNOSIS — M6281 Muscle weakness (generalized): Secondary | ICD-10-CM | POA: Diagnosis not present

## 2022-09-09 DIAGNOSIS — R2689 Other abnormalities of gait and mobility: Secondary | ICD-10-CM | POA: Diagnosis not present

## 2022-09-10 DIAGNOSIS — S72002D Fracture of unspecified part of neck of left femur, subsequent encounter for closed fracture with routine healing: Secondary | ICD-10-CM | POA: Diagnosis not present

## 2022-09-10 DIAGNOSIS — M6281 Muscle weakness (generalized): Secondary | ICD-10-CM | POA: Diagnosis not present

## 2022-09-10 DIAGNOSIS — Z9181 History of falling: Secondary | ICD-10-CM | POA: Diagnosis not present

## 2022-09-11 DIAGNOSIS — R2689 Other abnormalities of gait and mobility: Secondary | ICD-10-CM | POA: Diagnosis not present

## 2022-09-11 DIAGNOSIS — M6281 Muscle weakness (generalized): Secondary | ICD-10-CM | POA: Diagnosis not present

## 2022-09-12 DIAGNOSIS — M6281 Muscle weakness (generalized): Secondary | ICD-10-CM | POA: Diagnosis not present

## 2022-09-12 DIAGNOSIS — R2689 Other abnormalities of gait and mobility: Secondary | ICD-10-CM | POA: Diagnosis not present

## 2022-09-12 DIAGNOSIS — S72002D Fracture of unspecified part of neck of left femur, subsequent encounter for closed fracture with routine healing: Secondary | ICD-10-CM | POA: Diagnosis not present

## 2022-09-12 DIAGNOSIS — Z9181 History of falling: Secondary | ICD-10-CM | POA: Diagnosis not present

## 2022-09-15 DIAGNOSIS — R2689 Other abnormalities of gait and mobility: Secondary | ICD-10-CM | POA: Diagnosis not present

## 2022-09-15 DIAGNOSIS — M6281 Muscle weakness (generalized): Secondary | ICD-10-CM | POA: Diagnosis not present

## 2022-09-15 DIAGNOSIS — E538 Deficiency of other specified B group vitamins: Secondary | ICD-10-CM | POA: Diagnosis not present

## 2022-09-16 DIAGNOSIS — S72002D Fracture of unspecified part of neck of left femur, subsequent encounter for closed fracture with routine healing: Secondary | ICD-10-CM | POA: Diagnosis not present

## 2022-09-16 DIAGNOSIS — Z9181 History of falling: Secondary | ICD-10-CM | POA: Diagnosis not present

## 2022-09-16 DIAGNOSIS — M6281 Muscle weakness (generalized): Secondary | ICD-10-CM | POA: Diagnosis not present

## 2022-09-17 DIAGNOSIS — S72002D Fracture of unspecified part of neck of left femur, subsequent encounter for closed fracture with routine healing: Secondary | ICD-10-CM | POA: Diagnosis not present

## 2022-09-17 DIAGNOSIS — M6281 Muscle weakness (generalized): Secondary | ICD-10-CM | POA: Diagnosis not present

## 2022-09-17 DIAGNOSIS — Z9181 History of falling: Secondary | ICD-10-CM | POA: Diagnosis not present

## 2022-09-18 DIAGNOSIS — R488 Other symbolic dysfunctions: Secondary | ICD-10-CM | POA: Diagnosis not present

## 2022-09-19 DIAGNOSIS — R2689 Other abnormalities of gait and mobility: Secondary | ICD-10-CM | POA: Diagnosis not present

## 2022-09-19 DIAGNOSIS — M6281 Muscle weakness (generalized): Secondary | ICD-10-CM | POA: Diagnosis not present

## 2022-09-22 DIAGNOSIS — Z9181 History of falling: Secondary | ICD-10-CM | POA: Diagnosis not present

## 2022-09-22 DIAGNOSIS — M6281 Muscle weakness (generalized): Secondary | ICD-10-CM | POA: Diagnosis not present

## 2022-09-22 DIAGNOSIS — R2689 Other abnormalities of gait and mobility: Secondary | ICD-10-CM | POA: Diagnosis not present

## 2022-09-22 DIAGNOSIS — S72002D Fracture of unspecified part of neck of left femur, subsequent encounter for closed fracture with routine healing: Secondary | ICD-10-CM | POA: Diagnosis not present

## 2022-09-24 DIAGNOSIS — M6281 Muscle weakness (generalized): Secondary | ICD-10-CM | POA: Diagnosis not present

## 2022-09-24 DIAGNOSIS — R2689 Other abnormalities of gait and mobility: Secondary | ICD-10-CM | POA: Diagnosis not present

## 2022-09-26 DIAGNOSIS — S72002D Fracture of unspecified part of neck of left femur, subsequent encounter for closed fracture with routine healing: Secondary | ICD-10-CM | POA: Diagnosis not present

## 2022-09-26 DIAGNOSIS — M6281 Muscle weakness (generalized): Secondary | ICD-10-CM | POA: Diagnosis not present

## 2022-09-26 DIAGNOSIS — Z9181 History of falling: Secondary | ICD-10-CM | POA: Diagnosis not present

## 2022-09-29 DIAGNOSIS — Z9181 History of falling: Secondary | ICD-10-CM | POA: Diagnosis not present

## 2022-09-29 DIAGNOSIS — S72002D Fracture of unspecified part of neck of left femur, subsequent encounter for closed fracture with routine healing: Secondary | ICD-10-CM | POA: Diagnosis not present

## 2022-09-29 DIAGNOSIS — M6281 Muscle weakness (generalized): Secondary | ICD-10-CM | POA: Diagnosis not present

## 2022-10-02 DIAGNOSIS — R488 Other symbolic dysfunctions: Secondary | ICD-10-CM | POA: Diagnosis not present

## 2022-10-03 DIAGNOSIS — M6281 Muscle weakness (generalized): Secondary | ICD-10-CM | POA: Diagnosis not present

## 2022-10-03 DIAGNOSIS — R2689 Other abnormalities of gait and mobility: Secondary | ICD-10-CM | POA: Diagnosis not present

## 2022-10-06 DIAGNOSIS — M6281 Muscle weakness (generalized): Secondary | ICD-10-CM | POA: Diagnosis not present

## 2022-10-06 DIAGNOSIS — R2689 Other abnormalities of gait and mobility: Secondary | ICD-10-CM | POA: Diagnosis not present

## 2022-10-09 DIAGNOSIS — R2689 Other abnormalities of gait and mobility: Secondary | ICD-10-CM | POA: Diagnosis not present

## 2022-10-09 DIAGNOSIS — M6281 Muscle weakness (generalized): Secondary | ICD-10-CM | POA: Diagnosis not present

## 2022-10-13 DIAGNOSIS — R2689 Other abnormalities of gait and mobility: Secondary | ICD-10-CM | POA: Diagnosis not present

## 2022-10-13 DIAGNOSIS — M6281 Muscle weakness (generalized): Secondary | ICD-10-CM | POA: Diagnosis not present

## 2022-10-15 DIAGNOSIS — R2689 Other abnormalities of gait and mobility: Secondary | ICD-10-CM | POA: Diagnosis not present

## 2022-10-15 DIAGNOSIS — M6281 Muscle weakness (generalized): Secondary | ICD-10-CM | POA: Diagnosis not present

## 2022-12-16 ENCOUNTER — Other Ambulatory Visit: Payer: Self-pay | Admitting: Family Medicine

## 2022-12-16 DIAGNOSIS — Z1231 Encounter for screening mammogram for malignant neoplasm of breast: Secondary | ICD-10-CM

## 2023-01-19 DIAGNOSIS — S72032D Displaced midcervical fracture of left femur, subsequent encounter for closed fracture with routine healing: Secondary | ICD-10-CM | POA: Diagnosis not present

## 2023-01-28 ENCOUNTER — Ambulatory Visit: Payer: Medicare Other

## 2023-05-06 DIAGNOSIS — Z0000 Encounter for general adult medical examination without abnormal findings: Secondary | ICD-10-CM | POA: Diagnosis not present

## 2023-05-15 DIAGNOSIS — I10 Essential (primary) hypertension: Secondary | ICD-10-CM | POA: Diagnosis not present

## 2023-05-15 DIAGNOSIS — E538 Deficiency of other specified B group vitamins: Secondary | ICD-10-CM | POA: Diagnosis not present

## 2023-05-15 DIAGNOSIS — E559 Vitamin D deficiency, unspecified: Secondary | ICD-10-CM | POA: Diagnosis not present

## 2023-05-15 DIAGNOSIS — E782 Mixed hyperlipidemia: Secondary | ICD-10-CM | POA: Diagnosis not present

## 2023-06-01 DIAGNOSIS — I10 Essential (primary) hypertension: Secondary | ICD-10-CM | POA: Diagnosis not present

## 2023-06-01 DIAGNOSIS — Z23 Encounter for immunization: Secondary | ICD-10-CM | POA: Diagnosis not present

## 2023-06-01 DIAGNOSIS — E782 Mixed hyperlipidemia: Secondary | ICD-10-CM | POA: Diagnosis not present

## 2023-06-01 DIAGNOSIS — E538 Deficiency of other specified B group vitamins: Secondary | ICD-10-CM | POA: Diagnosis not present

## 2023-06-01 DIAGNOSIS — E559 Vitamin D deficiency, unspecified: Secondary | ICD-10-CM | POA: Diagnosis not present

## 2023-06-01 DIAGNOSIS — Z0000 Encounter for general adult medical examination without abnormal findings: Secondary | ICD-10-CM | POA: Diagnosis not present

## 2023-06-01 DIAGNOSIS — I700 Atherosclerosis of aorta: Secondary | ICD-10-CM | POA: Diagnosis not present

## 2023-07-02 DIAGNOSIS — D225 Melanocytic nevi of trunk: Secondary | ICD-10-CM | POA: Diagnosis not present

## 2023-07-02 DIAGNOSIS — Z85828 Personal history of other malignant neoplasm of skin: Secondary | ICD-10-CM | POA: Diagnosis not present

## 2023-07-02 DIAGNOSIS — L821 Other seborrheic keratosis: Secondary | ICD-10-CM | POA: Diagnosis not present

## 2023-07-02 DIAGNOSIS — L720 Epidermal cyst: Secondary | ICD-10-CM | POA: Diagnosis not present

## 2023-07-15 DIAGNOSIS — H01006 Unspecified blepharitis left eye, unspecified eyelid: Secondary | ICD-10-CM | POA: Diagnosis not present

## 2023-07-15 DIAGNOSIS — H401134 Primary open-angle glaucoma, bilateral, indeterminate stage: Secondary | ICD-10-CM | POA: Diagnosis not present

## 2023-07-15 DIAGNOSIS — H01003 Unspecified blepharitis right eye, unspecified eyelid: Secondary | ICD-10-CM | POA: Diagnosis not present

## 2023-07-15 DIAGNOSIS — H4321 Crystalline deposits in vitreous body, right eye: Secondary | ICD-10-CM | POA: Diagnosis not present

## 2023-07-15 DIAGNOSIS — H04123 Dry eye syndrome of bilateral lacrimal glands: Secondary | ICD-10-CM | POA: Diagnosis not present

## 2023-07-15 DIAGNOSIS — H353134 Nonexudative age-related macular degeneration, bilateral, advanced atrophic with subfoveal involvement: Secondary | ICD-10-CM | POA: Diagnosis not present

## 2023-07-20 DIAGNOSIS — H1033 Unspecified acute conjunctivitis, bilateral: Secondary | ICD-10-CM | POA: Diagnosis not present

## 2023-07-28 DIAGNOSIS — J00 Acute nasopharyngitis [common cold]: Secondary | ICD-10-CM | POA: Diagnosis not present

## 2023-07-28 DIAGNOSIS — Z6824 Body mass index (BMI) 24.0-24.9, adult: Secondary | ICD-10-CM | POA: Diagnosis not present

## 2023-11-09 DIAGNOSIS — J019 Acute sinusitis, unspecified: Secondary | ICD-10-CM | POA: Diagnosis not present

## 2024-01-11 DIAGNOSIS — H401134 Primary open-angle glaucoma, bilateral, indeterminate stage: Secondary | ICD-10-CM | POA: Diagnosis not present

## 2024-02-11 DIAGNOSIS — S40812A Abrasion of left upper arm, initial encounter: Secondary | ICD-10-CM | POA: Diagnosis not present

## 2024-02-11 DIAGNOSIS — Z6824 Body mass index (BMI) 24.0-24.9, adult: Secondary | ICD-10-CM | POA: Diagnosis not present

## 2024-02-17 DIAGNOSIS — H401134 Primary open-angle glaucoma, bilateral, indeterminate stage: Secondary | ICD-10-CM | POA: Diagnosis not present

## 2024-03-10 DIAGNOSIS — L820 Inflamed seborrheic keratosis: Secondary | ICD-10-CM | POA: Diagnosis not present

## 2024-04-11 DIAGNOSIS — H60502 Unspecified acute noninfective otitis externa, left ear: Secondary | ICD-10-CM | POA: Diagnosis not present

## 2024-04-11 DIAGNOSIS — H9202 Otalgia, left ear: Secondary | ICD-10-CM | POA: Diagnosis not present

## 2024-05-26 DIAGNOSIS — Z0000 Encounter for general adult medical examination without abnormal findings: Secondary | ICD-10-CM | POA: Diagnosis not present

## 2024-05-26 DIAGNOSIS — Z23 Encounter for immunization: Secondary | ICD-10-CM | POA: Diagnosis not present

## 2024-05-27 ENCOUNTER — Other Ambulatory Visit (HOSPITAL_BASED_OUTPATIENT_CLINIC_OR_DEPARTMENT_OTHER): Payer: Self-pay | Admitting: Family Medicine

## 2024-05-27 DIAGNOSIS — Z780 Asymptomatic menopausal state: Secondary | ICD-10-CM

## 2024-09-21 ENCOUNTER — Ambulatory Visit

## 2024-09-21 ENCOUNTER — Encounter: Payer: Self-pay | Admitting: Podiatry

## 2024-09-21 ENCOUNTER — Ambulatory Visit: Admitting: Podiatry

## 2024-09-21 ENCOUNTER — Other Ambulatory Visit: Payer: Self-pay

## 2024-09-21 VITALS — Ht 64.0 in | Wt 114.9 lb

## 2024-09-21 DIAGNOSIS — D2372 Other benign neoplasm of skin of left lower limb, including hip: Secondary | ICD-10-CM | POA: Diagnosis not present

## 2024-09-21 NOTE — Progress Notes (Signed)
" ° °  Chief Complaint  Patient presents with   Foot Pain    Pt is heel due to left heel pain, she states this has been going on for about a month, no injury, seen here PCP for this issue and had xrays done and was told that it could be a heel spur.    Subjective: 89 y.o. female presenting to the office today for evaluation of left heel pain ongoing for about 1 month.  Idiopathic onset.  No history of injury or change in activity   Past Medical History:  Diagnosis Date   Anal fissure    Anemia    Anxiety    Arthritis    Depression    Glaucoma    Hearing impairment    History of bleeding ulcers    Hyperlipidemia    Hypertension    Legally blind    Macular degeneration    Macular degeneration    Stomach ulcer    Thyroid  disease    UTI (urinary tract infection)     Past Surgical History:  Procedure Laterality Date   APPENDECTOMY     BACK SURGERY     FINGER SURGERY     Left Little Finger   TOTAL HIP ARTHROPLASTY Left 06/05/2022   Procedure: hip hemiarthroplasty anterior approach;  Surgeon: Fidel Rogue, MD;  Location: [facility];  Service: Orthopedics;  Laterality: Left;    Allergies[1]   Objective:  Physical Exam General: Alert and oriented x3 in no acute distress  Dermatology: Hyperkeratotic lesion(s) present on the plantar aspect of the left heel. Pain on palpation with a central nucleated core noted. Skin is warm, dry and supple bilateral lower extremities. Negative for open lesions or macerations.  Vascular: Palpable pedal pulses bilaterally. No edema or erythema noted. Capillary refill within normal limits.  Neurological: Grossly intact via light touch  Musculoskeletal Exam: Pain on palpation at the keratotic lesion(s) noted. Range of motion within normal limits bilateral. Muscle strength 5/5 in all groups bilateral.  Assessment: 1.  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  Patient felt significant relief after debridement -Salicylic acid applied with a bandaid -Recommend OTC salicylic acid daily -Return to the clinic 3 weeks  Thresa EMERSON Sar, DPM [facility]  Dr. Thresa EMERSON Sar, DPM    [address] [place], [place] [address]                Office [phone]  Fax [phone]        [1]  Allergies Allergen Reactions   Neo-Synephrine [Oxymetazoline] Other (See Comments)    Hyper   Prednisone Other (See Comments)    Hyper   "

## 2024-10-01 ENCOUNTER — Emergency Department (HOSPITAL_BASED_OUTPATIENT_CLINIC_OR_DEPARTMENT_OTHER): Admitting: Radiology

## 2024-10-01 ENCOUNTER — Encounter (HOSPITAL_BASED_OUTPATIENT_CLINIC_OR_DEPARTMENT_OTHER): Payer: Self-pay

## 2024-10-01 ENCOUNTER — Other Ambulatory Visit: Payer: Self-pay

## 2024-10-01 ENCOUNTER — Emergency Department (HOSPITAL_BASED_OUTPATIENT_CLINIC_OR_DEPARTMENT_OTHER)
Admission: EM | Admit: 2024-10-01 | Discharge: 2024-10-01 | Disposition: A | Discharge status: Home / Self Care | Attending: Emergency Medicine | Admitting: Emergency Medicine

## 2024-10-01 DIAGNOSIS — M25532 Pain in left wrist: Secondary | ICD-10-CM | POA: Diagnosis present

## 2024-10-01 MED ORDER — DEXAMETHASONE 4 MG PO TABS
10.0000 mg | ORAL_TABLET | Freq: Once | ORAL | Status: AC
  Administered 2024-10-01: 10 mg via ORAL
  Filled 2024-10-01: qty 3

## 2024-10-01 NOTE — ED Triage Notes (Signed)
 Patient arrives POV with complaints of ongoing left wrist pain x4 days. Patient reports that she did not fall. Unsure what caused the pain and swelling. Seen at a Orthopedic clinic where she had imaging and was given a wrist brace. She has not received the xray results

## 2024-10-01 NOTE — ED Provider Notes (Signed)
 " Dana Blake EMERGENCY DEPARTMENT AT [facility] Provider Note   CSN: [id number] Arrival date & time: 10/01/24  9156     Patient presents with: Wrist Pain (Left )   Dana Blake is a 89 y.o. female.   Is here with left wrist pain and swelling the last couple days.  She had a x-ray done at [facility] clinic the other day but has not gotten a report yet.  She was placed in a wrist splint.  She denies any fevers chills.  She denies any weakness numbness tingling.  She does use a walker to ambulate.  She denies any falls.  She has history of hypertension high cholesterol.  The history is provided by the patient.       Prior to Admission medications  Medication Sig Start Date End Date Taking? Authorizing Provider  Cholecalciferol (VITAMIN D3) 50 MCG (2000 UT) TABS Take 2,000 Units by mouth daily.    [provider]  feeding supplement (ENSURE ENLIVE / ENSURE PLUS) LIQD Take 237 mLs by mouth 2 (two) times daily between meals. 06/09/22   Krishnan, Gokul, MD  latanoprost  (XALATAN ) 0.005 % ophthalmic solution Place 1 drop into both eyes at bedtime.     [provider]  methocarbamol  (ROBAXIN ) 500 MG tablet Take 1 tablet (500 mg total) by mouth every 6 (six) hours as needed for muscle spasms. 06/09/22   Krishnan, Gokul, MD  metoprolol  tartrate (LOPRESSOR ) 25 MG tablet Take 0.5 tablets (12.5 mg total) by mouth 2 (two) times daily. 06/09/22   Krishnan, Gokul, MD  omeprazole (PRILOSEC) 20 MG capsule Take 20 mg by mouth daily. Patient not taking: Reported on 09/21/2024    [provider]  polyethylene glycol (MIRALAX  / GLYCOLAX ) 17 g packet Take 17 g by mouth daily. 06/10/22   Krishnan, Gokul, MD  senna-docusate (SENOKOT-S) 8.6-50 MG tablet Take 2 tablets by mouth 2 (two) times daily. 06/09/22   Verdene Purchase, MD    Allergies: Neo-synephrine [oxymetazoline] and Prednisone    Review of Systems  Updated Vital Signs BP (!) 158/68 (BP Location: Right Arm)   Pulse 63    Temp 98.1 F (36.7 C)   Resp 16   Ht 5' 4 (1.626 m)   Wt 52.1 kg   SpO2 99%   BMI 19.72 kg/m   Physical Exam Constitutional:      General: She is not in acute distress.    Appearance: She is not ill-appearing.  HENT:     Nose: Nose normal.     Mouth/Throat:     Mouth: Mucous membranes are moist.  Cardiovascular:     Pulses: Normal pulses.  Musculoskeletal:        General: Swelling and tenderness present. Normal range of motion.     Cervical back: Normal range of motion.     Comments: Tenderness to the left wrist with little bit of swelling but no erythema or warmth she can flex and extend at the wrist without any major difficulty  Skin:    General: Skin is warm.     Capillary Refill: Capillary refill takes less than 2 seconds.     Findings: No erythema.  Neurological:     General: No focal deficit present.     Mental Status: She is alert and oriented to person, place, and time.     Sensory: No sensory deficit.     Motor: No weakness.     (all labs ordered are listed, but only abnormal results are displayed) Labs  Reviewed - No data to display  EKG: None  Radiology: DG Wrist Complete Left Result Date: 10/01/2024 EXAM: 3 or more VIEW(S) XRAY OF THE LEFT WRIST 10/01/2024 09:23:57 AM COMPARISON: Comparison 09/29/2024 and previous. CLINICAL HISTORY: Left hand and wrist pain. FINDINGS: BONES AND JOINTS: No acute fracture. No malalignment. 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 Chondrocalcinosis of the TFCC. Osteopenia. SOFT TISSUES: Soft tissue edema. IMPRESSION: 1. Soft tissue edema. 2. 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 3. Chondrocalcinosis of the TFCC. Electronically signed by: Katheleen Faes MD 10/01/2024 09:51 AM EST RP Workstation: [id number]     Procedures   Medications Ordered in the ED  dexamethasone  (DECADRON ) tablet 10 mg (10 mg Oral Given 10/01/24 0934)                                    Medical Decision Making Amount and/or Complexity  of Data Reviewed Radiology: ordered.  Risk Prescription drug management.   Dana Blake is here with pain in her left wrist.  Normal vitals.  No fever.  Differential diagnosis likely inflammatory arthritis type process.  She uses a walker to ambulate.  She did not have any falls.  Does not look infectious on exam.  There is no redness or major warmth.  She is able to flex and extend without any major issues.  I have no concern for septic joint.  X-ray showed no fracture or malalignment.  Arthritic changes and edema.  I do think that this is inflammatory.  She has reaction to prednisone we will give her a dose of Decadron .  Recommend that she continue to wear her wrist splint ice and Tylenol  and rest.  I recommend that she use her walker minimally and try to give the wrist joint some relief is much as she can.  I do think that likely from using the walker and having some underlying arthritis she inflamed this area.  She has no fever and clinically I have no concern for infectious process.  She is neurovascular neuromuscular intact otherwise.  Will have her follow-up with hand.  Told to return if symptoms worsen.  This chart was dictated using voice recognition software.  Despite best efforts to proofread,  errors can occur which can change the documentation meaning.      Final diagnoses:  Left wrist pain    ED Discharge Orders     None          Ruthe Cornet, DO 10/01/24 9041  "

## 2024-10-01 NOTE — ED Notes (Signed)
 Pt already has wrist brace from home that she is going to use!

## 2024-10-01 NOTE — Discharge Instructions (Signed)
 Continue ice 20 minutes on several times a day, recommend Tylenol  and splint.  Overall I think you have arthritis and inflammation in your wrist joint that hopefully should get better with rest.

## 2024-10-12 ENCOUNTER — Ambulatory Visit: Admitting: Podiatry
